# Patient Record
Sex: Female | Born: 1960
Health system: Southern US, Community
[De-identification: ages and names within clinical notes are randomized; demographics above are authoritative.]

## PROBLEM LIST (undated history)

## (undated) DIAGNOSIS — Z8489 Family history of other specified conditions: Secondary | ICD-10-CM

## (undated) DIAGNOSIS — K219 Gastro-esophageal reflux disease without esophagitis: Secondary | ICD-10-CM

## (undated) DIAGNOSIS — I341 Nonrheumatic mitral (valve) prolapse: Secondary | ICD-10-CM

## (undated) DIAGNOSIS — G4730 Sleep apnea, unspecified: Secondary | ICD-10-CM

## (undated) DIAGNOSIS — M1990 Unspecified osteoarthritis, unspecified site: Secondary | ICD-10-CM

## (undated) DIAGNOSIS — G43909 Migraine, unspecified, not intractable, without status migrainosus: Secondary | ICD-10-CM

## (undated) DIAGNOSIS — R42 Dizziness and giddiness: Secondary | ICD-10-CM

## (undated) DIAGNOSIS — E785 Hyperlipidemia, unspecified: Secondary | ICD-10-CM

## (undated) DIAGNOSIS — J45909 Unspecified asthma, uncomplicated: Secondary | ICD-10-CM

## (undated) HISTORY — DX: Hyperlipidemia, unspecified: E78.5

## (undated) HISTORY — PX: TONSILLECTOMY: SUR1361

## (undated) HISTORY — PX: DILATION AND CURETTAGE OF UTERUS: SHX78

## (undated) HISTORY — PX: BILATERAL TEMPOROMANDIBULAR JOINT ARTHROPLASTY: SUR77

## (undated) HISTORY — PX: WISDOM TOOTH EXTRACTION: SHX21

---

## 2005-05-19 ENCOUNTER — Ambulatory Visit: Payer: Self-pay | Admitting: Unknown Physician Specialty

## 2008-04-18 ENCOUNTER — Ambulatory Visit: Payer: Self-pay | Admitting: Unknown Physician Specialty

## 2009-06-12 ENCOUNTER — Ambulatory Visit: Payer: Self-pay | Admitting: Unknown Physician Specialty

## 2009-12-26 ENCOUNTER — Ambulatory Visit: Payer: Self-pay | Admitting: Family Medicine

## 2010-07-15 ENCOUNTER — Ambulatory Visit: Payer: Self-pay | Admitting: Unknown Physician Specialty

## 2010-08-02 HISTORY — PX: COLONOSCOPY: SHX5424

## 2010-08-21 ENCOUNTER — Ambulatory Visit: Payer: Self-pay | Admitting: Gastroenterology

## 2010-08-21 LAB — HM COLONOSCOPY

## 2011-06-27 ENCOUNTER — Ambulatory Visit: Payer: Self-pay

## 2013-04-09 ENCOUNTER — Ambulatory Visit: Payer: Self-pay | Admitting: Family Medicine

## 2015-10-20 ENCOUNTER — Ambulatory Visit (INDEPENDENT_AMBULATORY_CARE_PROVIDER_SITE_OTHER): Payer: BLUE CROSS/BLUE SHIELD | Admitting: Family Medicine

## 2015-10-20 ENCOUNTER — Encounter: Payer: Self-pay | Admitting: Family Medicine

## 2015-10-20 VITALS — BP 102/70 | HR 88 | Temp 97.9°F | Ht 66.0 in | Wt 180.0 lb

## 2015-10-20 DIAGNOSIS — E785 Hyperlipidemia, unspecified: Secondary | ICD-10-CM

## 2015-10-20 DIAGNOSIS — J40 Bronchitis, not specified as acute or chronic: Secondary | ICD-10-CM | POA: Diagnosis not present

## 2015-10-20 DIAGNOSIS — J0100 Acute maxillary sinusitis, unspecified: Secondary | ICD-10-CM | POA: Diagnosis not present

## 2015-10-20 MED ORDER — AMOXICILLIN 500 MG PO CAPS: 500.0000 mg | ORAL_CAPSULE | Freq: Three times a day (TID) | ORAL | Status: DC

## 2015-10-20 MED ORDER — GUAIFENESIN-CODEINE 100-10 MG/5ML PO SYRP: 5.0000 mL | ORAL_SOLUTION | Freq: Three times a day (TID) | ORAL | Status: DC | PRN

## 2015-10-20 NOTE — Progress Notes (Signed)
Name: Kerri Preston   MRN: [id number]    DOB: 06-30-1961   Date:10/20/2015       Progress Note  Subjective  Chief Complaint  Chief Complaint  Patient presents with  . Establish Care  . Cough    cough and cong- dry cough not productive- chest wall pain, myalgias- taking ibuprofen otc for aches, low grade fever on Saturday  . Hyperlipidemia    Cough This is a new problem. The current episode started in the past 7 days. The problem has been gradually worsening. The problem occurs every few minutes. The cough is non-productive. Associated symptoms include a fever, headaches, nasal congestion, postnasal drip, rhinorrhea and a sore throat. Pertinent negatives include no chest pain, chills, ear congestion, ear pain, heartburn, hemoptysis, myalgias, rash, shortness of breath, sweats, weight loss or wheezing. The symptoms are aggravated by pollens. Treatments tried: ibuprofen. The treatment provided mild relief. Her past medical history is significant for asthma and environmental allergies. There is no history of bronchitis or pneumonia.  Hyperlipidemia This is a chronic problem. The current episode started more than 1 year ago. The problem is controlled. Recent lipid tests were reviewed and are normal. There are no known factors aggravating her hyperlipidemia. Pertinent negatives include no chest pain, focal sensory loss, focal weakness, leg pain, myalgias or shortness of breath. Current antihyperlipidemic treatment includes statins and diet change (present off pravastatin). The current treatment provides mild improvement of lipids. There are no compliance problems.     No problem-specific assessment & plan notes found for this encounter.   Past Medical History  Diagnosis Date  . Hyperlipidemia     Past Surgical History  Procedure Laterality Date  . Bilateral temporomandibular joint arthroplasty    . Colonoscopy  2012    cleared for 10 yrs    Family History  Problem Relation Age of Onset   . Hypertension Father     Social History   Social History  . Marital Status: Married    Spouse Name: N/A  . Number of Children: N/A  . Years of Education: N/A   Occupational History  . Not on file.   Social History Main Topics  . Smoking status: Never Smoker   . Smokeless tobacco: Not on file  . Alcohol Use: No  . Drug Use: No  . Sexual Activity: Yes   Other Topics Concern  . Not on file   Social History Narrative  . No narrative on file    Not on File   Review of Systems  Constitutional: Positive for fever. Negative for chills, weight loss and malaise/fatigue.  HENT: Positive for postnasal drip, rhinorrhea and sore throat. Negative for ear discharge and ear pain.   Eyes: Negative for blurred vision.  Respiratory: Positive for cough. Negative for hemoptysis, sputum production, shortness of breath and wheezing.   Cardiovascular: Negative for chest pain, palpitations and leg swelling.  Gastrointestinal: Negative for heartburn, nausea, abdominal pain, diarrhea, constipation, blood in stool and melena.  Genitourinary: Negative for dysuria, urgency, frequency and hematuria.  Musculoskeletal: Negative for myalgias, back pain, joint pain and neck pain.  Skin: Negative for rash.  Neurological: Positive for headaches. Negative for dizziness, tingling, sensory change and focal weakness.  Endo/Heme/Allergies: Positive for environmental allergies. Negative for polydipsia. Does not bruise/bleed easily.  Psychiatric/Behavioral: Negative for depression and suicidal ideas. The patient is not nervous/anxious and does not have insomnia.      Objective  Filed Vitals:   10/20/15 1055  BP: 102/70  Pulse:  88  Temp: 97.9 F (36.6 C)  TempSrc: Oral  Height: 5\' 6"  (1.676 m)  Weight: 180 lb (81.647 kg)    Physical Exam  Constitutional: She is well-developed, well-nourished, and in no distress. No distress.  HENT:  Head: Normocephalic and atraumatic.  Right Ear: External ear  normal.  Left Ear: External ear normal.  Nose: Nose normal.  Mouth/Throat: Oropharynx is clear and moist.  Eyes: Conjunctivae and EOM are normal. Pupils are equal, round, and reactive to light. Right eye exhibits no discharge. Left eye exhibits no discharge.  Neck: Normal range of motion. Neck supple. No JVD present. No thyromegaly present.  Cardiovascular: Normal rate, regular rhythm, normal heart sounds and intact distal pulses.  Exam reveals no gallop and no friction rub.   No murmur heard. Pulmonary/Chest: Effort normal and breath sounds normal.  Abdominal: Soft. Bowel sounds are normal. She exhibits no mass. There is no tenderness. There is no guarding.  Musculoskeletal: Normal range of motion. She exhibits no edema.  Lymphadenopathy:    She has no cervical adenopathy.  Neurological: She is alert. She has normal reflexes.  Skin: Skin is warm and dry. She is not diaphoretic.  Psychiatric: Mood and affect normal.      Assessment & Plan  Problem List Items Addressed This Visit    None    Visit Diagnoses    Acute maxillary sinusitis, recurrence not specified    -  Primary    Relevant Medications    amoxicillin (AMOXIL) 500 MG capsule    guaiFENesin-codeine (ROBITUSSIN AC) 100-10 MG/5ML syrup    Bronchitis        Relevant Medications    amoxicillin (AMOXIL) 500 MG capsule    guaiFENesin-codeine (ROBITUSSIN AC) 100-10 MG/5ML syrup    Hyperlipidemia        currently diet controlled    Relevant Medications    pravastatin (PRAVACHOL) 10 MG tablet    Other Relevant Orders    Lipid Profile         Dr. Otilio Miu [facility]  10/20/2015

## 2015-10-21 LAB — LIPID PANEL
Chol/HDL Ratio: 5.6 ratio units — ABNORMAL HIGH (ref 0.0–4.4)
Cholesterol, Total: 257 mg/dL — ABNORMAL HIGH (ref 100–199)
HDL: 46 mg/dL (ref >39)
LDL Calculated: 132 mg/dL — ABNORMAL HIGH (ref 0–99)
Triglycerides: 394 mg/dL — ABNORMAL HIGH (ref 0–149)
VLDL Cholesterol Cal: 79 mg/dL — ABNORMAL HIGH (ref 5–40)

## 2015-10-21 MED ORDER — PRAVASTATIN SODIUM 10 MG PO TABS: 10.0000 mg | ORAL_TABLET | Freq: Every day | ORAL | Status: DC

## 2015-10-21 NOTE — Addendum Note (Signed)
Addended by: Fredderick Severance on: 10/21/2015 11:44 AM   Modules accepted: Orders

## 2015-10-28 ENCOUNTER — Ambulatory Visit (INDEPENDENT_AMBULATORY_CARE_PROVIDER_SITE_OTHER): Payer: BLUE CROSS/BLUE SHIELD | Admitting: Family Medicine

## 2015-10-28 ENCOUNTER — Encounter: Payer: Self-pay | Admitting: Family Medicine

## 2015-10-28 ENCOUNTER — Other Ambulatory Visit: Payer: Self-pay

## 2015-10-28 ENCOUNTER — Ambulatory Visit
Admission: RE | Admit: 2015-10-28 | Discharge: 2015-10-28 | Disposition: A | Discharge status: Home / Self Care | Payer: BLUE CROSS/BLUE SHIELD | Source: Ambulatory Visit | Attending: Family Medicine | Admitting: Family Medicine

## 2015-10-28 VITALS — BP 100/62 | HR 76 | Temp 98.0°F | Ht 66.0 in | Wt 180.0 lb

## 2015-10-28 DIAGNOSIS — J0100 Acute maxillary sinusitis, unspecified: Secondary | ICD-10-CM | POA: Diagnosis not present

## 2015-10-28 DIAGNOSIS — J4521 Mild intermittent asthma with (acute) exacerbation: Secondary | ICD-10-CM

## 2015-10-28 DIAGNOSIS — J40 Bronchitis, not specified as acute or chronic: Secondary | ICD-10-CM | POA: Diagnosis not present

## 2015-10-28 MED ORDER — BENZONATATE 100 MG PO CAPS: 100.0000 mg | ORAL_CAPSULE | Freq: Two times a day (BID) | ORAL | Status: DC | PRN

## 2015-10-28 MED ORDER — ALBUTEROL SULFATE HFA 108 (90 BASE) MCG/ACT IN AERS: 2.0000 | INHALATION_SPRAY | Freq: Four times a day (QID) | RESPIRATORY_TRACT | Status: DC | PRN

## 2015-10-28 MED ORDER — AZITHROMYCIN 250 MG PO TABS: ORAL_TABLET | ORAL | Status: DC

## 2015-10-28 NOTE — Progress Notes (Signed)
Name: Kerri Preston   MRN: [id number]    DOB: 02/28/1961   Date:10/28/2015       Progress Note  Subjective  Chief Complaint  Chief Complaint  Patient presents with  . Sinusitis    started Amox. on 3/20- was feeling better until last night- cough got worse, feeling weak and sob    Sinusitis This is a recurrent problem. The current episode started 1 to 4 weeks ago. The problem has been waxing and waning since onset. There has been no fever. The pain is mild. Associated symptoms include congestion, coughing, ear pain, headaches, sinus pressure, sneezing and a sore throat. Pertinent negatives include no chills, diaphoresis, hoarse voice, neck pain, shortness of breath or swollen glands. Past treatments include acetaminophen (amoxil). The treatment provided mild relief.  Cough This is a recurrent problem. The problem has been waxing and waning. The cough is non-productive. Associated symptoms include ear pain, headaches, nasal congestion, postnasal drip, rhinorrhea and a sore throat. Pertinent negatives include no chest pain, chills, fever, heartburn, myalgias, rash, shortness of breath, weight loss or wheezing. The treatment provided mild relief. There is no history of environmental allergies.    No problem-specific assessment & plan notes found for this encounter.   Past Medical History  Diagnosis Date  . Hyperlipidemia     Past Surgical History  Procedure Laterality Date  . Bilateral temporomandibular joint arthroplasty    . Colonoscopy  2012    cleared for 10 yrs    Family History  Problem Relation Age of Onset  . Hypertension Father     Social History   Social History  . Marital Status: Married    Spouse Name: N/A  . Number of Children: N/A  . Years of Education: N/A   Occupational History  . Not on file.   Social History Main Topics  . Smoking status: Never Smoker   . Smokeless tobacco: Not on file  . Alcohol Use: No  . Drug Use: No  . Sexual Activity: Yes    Other Topics Concern  . Not on file   Social History Narrative    No Known Allergies   Review of Systems  Constitutional: Negative for fever, chills, weight loss, malaise/fatigue and diaphoresis.  HENT: Positive for congestion, ear pain, postnasal drip, rhinorrhea, sinus pressure, sneezing and sore throat. Negative for ear discharge and hoarse voice.   Eyes: Negative for blurred vision.  Respiratory: Positive for cough. Negative for sputum production, shortness of breath and wheezing.   Cardiovascular: Negative for chest pain, palpitations and leg swelling.  Gastrointestinal: Negative for heartburn, nausea, abdominal pain, diarrhea, constipation, blood in stool and melena.  Genitourinary: Negative for dysuria, urgency, frequency and hematuria.  Musculoskeletal: Negative for myalgias, back pain, joint pain and neck pain.  Skin: Negative for rash.  Neurological: Positive for headaches. Negative for dizziness, tingling, sensory change and focal weakness.  Endo/Heme/Allergies: Negative for environmental allergies and polydipsia. Does not bruise/bleed easily.  Psychiatric/Behavioral: Negative for depression and suicidal ideas. The patient is not nervous/anxious and does not have insomnia.      Objective  Filed Vitals:   10/28/15 1607  BP: 100/62  Pulse: 76  Temp: 98 F (36.7 C)  TempSrc: Oral  Height: 5\' 6"  (1.676 m)  Weight: 180 lb (81.647 kg)  SpO2: 99%    Physical Exam  Constitutional: She is well-developed, well-nourished, and in no distress. No distress.  HENT:  Head: Normocephalic and atraumatic.  Right Ear: External ear normal.  Left Ear:  External ear normal.  Nose: Nose normal.  Mouth/Throat: Oropharynx is clear and moist.  Eyes: Conjunctivae and EOM are normal. Pupils are equal, round, and reactive to light. Right eye exhibits no discharge. Left eye exhibits no discharge.  Neck: Normal range of motion. Neck supple. No JVD present. No thyromegaly present.   Cardiovascular: Normal rate, regular rhythm, normal heart sounds and intact distal pulses.  Exam reveals no gallop and no friction rub.   No murmur heard. Pulmonary/Chest: Effort normal and breath sounds normal.  Abdominal: Soft. Bowel sounds are normal. She exhibits no mass. There is no tenderness. There is no guarding.  Musculoskeletal: Normal range of motion. She exhibits no edema.  Lymphadenopathy:    She has no cervical adenopathy.  Neurological: She is alert. She has normal reflexes.  Skin: Skin is warm and dry. She is not diaphoretic.  Psychiatric: Mood and affect normal.  Nursing note and vitals reviewed.     Assessment & Plan  Problem List Items Addressed This Visit    None    Visit Diagnoses    Bronchitis    -  Primary    Relevant Medications    azithromycin (ZITHROMAX) 250 MG tablet    benzonatate (TESSALON) 100 MG capsule    Other Relevant Orders    DG Chest 2 View    Reactive airway disease, mild intermittent, with acute exacerbation        symbicort    Relevant Medications    albuterol (PROVENTIL HFA;VENTOLIN HFA) 108 (90 Base) MCG/ACT inhaler    Other Relevant Orders    DG Chest 2 View    Acute maxillary sinusitis, recurrence not specified        Relevant Medications    azithromycin (ZITHROMAX) 250 MG tablet    benzonatate (TESSALON) 100 MG capsule         Dr. Nickalus Thornsberry [facility]  10/28/2015

## 2015-10-29 ENCOUNTER — Ambulatory Visit
Admission: RE | Admit: 2015-10-29 | Discharge: 2015-10-29 | Disposition: A | Discharge status: Home / Self Care | Payer: BLUE CROSS/BLUE SHIELD | Source: Ambulatory Visit | Attending: Family Medicine | Admitting: Family Medicine

## 2015-10-29 DIAGNOSIS — J209 Acute bronchitis, unspecified: Secondary | ICD-10-CM | POA: Diagnosis not present

## 2015-10-29 DIAGNOSIS — J40 Bronchitis, not specified as acute or chronic: Secondary | ICD-10-CM | POA: Diagnosis present

## 2015-10-29 DIAGNOSIS — J4521 Mild intermittent asthma with (acute) exacerbation: Secondary | ICD-10-CM

## 2016-01-05 ENCOUNTER — Other Ambulatory Visit: Payer: Self-pay

## 2016-01-05 DIAGNOSIS — E785 Hyperlipidemia, unspecified: Secondary | ICD-10-CM

## 2016-01-05 MED ORDER — PRAVASTATIN SODIUM 10 MG PO TABS: 10.0000 mg | ORAL_TABLET | Freq: Every day | ORAL | Status: DC

## 2016-01-19 DIAGNOSIS — D485 Neoplasm of uncertain behavior of skin: Secondary | ICD-10-CM | POA: Diagnosis not present

## 2016-01-19 DIAGNOSIS — L570 Actinic keratosis: Secondary | ICD-10-CM | POA: Diagnosis not present

## 2016-01-19 DIAGNOSIS — L814 Other melanin hyperpigmentation: Secondary | ICD-10-CM | POA: Diagnosis not present

## 2016-02-01 ENCOUNTER — Other Ambulatory Visit: Payer: Self-pay | Admitting: Family Medicine

## 2016-03-10 ENCOUNTER — Other Ambulatory Visit: Payer: Self-pay | Admitting: Family Medicine

## 2016-03-23 DIAGNOSIS — Z124 Encounter for screening for malignant neoplasm of cervix: Secondary | ICD-10-CM | POA: Diagnosis not present

## 2016-03-23 DIAGNOSIS — Z01419 Encounter for gynecological examination (general) (routine) without abnormal findings: Secondary | ICD-10-CM | POA: Diagnosis not present

## 2016-03-23 LAB — HM PAP SMEAR: HM Pap smear: NORMAL

## 2016-04-22 ENCOUNTER — Other Ambulatory Visit: Payer: Self-pay | Admitting: Advanced Practice Midwife

## 2016-04-22 DIAGNOSIS — Z1231 Encounter for screening mammogram for malignant neoplasm of breast: Secondary | ICD-10-CM

## 2016-05-06 ENCOUNTER — Ambulatory Visit
Admission: RE | Admit: 2016-05-06 | Discharge: 2016-05-06 | Disposition: A | Discharge status: Home / Self Care | Payer: BLUE CROSS/BLUE SHIELD | Source: Ambulatory Visit | Attending: Advanced Practice Midwife | Admitting: Advanced Practice Midwife

## 2016-05-06 DIAGNOSIS — Z1231 Encounter for screening mammogram for malignant neoplasm of breast: Secondary | ICD-10-CM | POA: Insufficient documentation

## 2016-05-12 ENCOUNTER — Inpatient Hospital Stay
Admission: RE | Admit: 2016-05-12 | Discharge: 2016-05-12 | Disposition: A | Discharge status: Home / Self Care | Payer: Self-pay | Source: Ambulatory Visit | Attending: *Deleted | Admitting: *Deleted

## 2016-05-12 ENCOUNTER — Other Ambulatory Visit: Payer: Self-pay | Admitting: *Deleted

## 2016-05-12 DIAGNOSIS — Z1231 Encounter for screening mammogram for malignant neoplasm of breast: Secondary | ICD-10-CM

## 2016-06-22 DIAGNOSIS — Z23 Encounter for immunization: Secondary | ICD-10-CM | POA: Diagnosis not present

## 2016-06-26 ENCOUNTER — Other Ambulatory Visit: Payer: Self-pay | Admitting: Family Medicine

## 2016-06-28 ENCOUNTER — Other Ambulatory Visit: Payer: Self-pay

## 2016-07-20 DIAGNOSIS — L814 Other melanin hyperpigmentation: Secondary | ICD-10-CM | POA: Diagnosis not present

## 2016-07-28 ENCOUNTER — Other Ambulatory Visit: Payer: Self-pay | Admitting: Family Medicine

## 2016-08-26 ENCOUNTER — Ambulatory Visit (INDEPENDENT_AMBULATORY_CARE_PROVIDER_SITE_OTHER): Payer: BLUE CROSS/BLUE SHIELD | Admitting: Family Medicine

## 2016-08-26 VITALS — BP 120/74 | HR 86 | Temp 98.1°F | Ht 66.0 in | Wt 178.0 lb

## 2016-08-26 DIAGNOSIS — J0100 Acute maxillary sinusitis, unspecified: Secondary | ICD-10-CM

## 2016-08-26 DIAGNOSIS — J40 Bronchitis, not specified as acute or chronic: Secondary | ICD-10-CM

## 2016-08-26 LAB — POCT INFLUENZA A/B
Influenza A, POC: NEGATIVE
Influenza B, POC: NEGATIVE

## 2016-08-26 MED ORDER — AMOXICILLIN 500 MG PO CAPS: 500.0000 mg | ORAL_CAPSULE | Freq: Three times a day (TID) | ORAL | 0 refills | Status: DC

## 2016-08-26 MED ORDER — GUAIFENESIN-CODEINE 100-10 MG/5ML PO SYRP: 5.0000 mL | ORAL_SOLUTION | Freq: Three times a day (TID) | ORAL | 0 refills | Status: DC | PRN

## 2016-08-26 NOTE — Progress Notes (Signed)
Name: Kerri Preston   MRN: [id number]    DOB: 1961/02/11   Date:08/26/2016       Progress Note  Subjective  Chief Complaint  Chief Complaint  Patient presents with  . Sinusitis    cough, cong, sore throat, back pain, ears popping    Sinusitis  This is a new problem. The current episode started in the past 7 days. The problem has been gradually worsening since onset. There has been no fever. The pain is mild (headache). Associated symptoms include chills, congestion, coughing, headaches, a hoarse voice, shortness of breath, sinus pressure, sneezing and a sore throat. Pertinent negatives include no diaphoresis, ear pain, neck pain or swollen glands. (Cough with "nasty taste") Past treatments include oral decongestants. The treatment provided no relief.    No problem-specific Assessment & Plan notes found for this encounter.   Past Medical History:  Diagnosis Date  . Hyperlipidemia     Past Surgical History:  Procedure Laterality Date  . BILATERAL TEMPOROMANDIBULAR JOINT ARTHROPLASTY    . COLONOSCOPY  2012   cleared for 10 yrs    Family History  Problem Relation Age of Onset  . Hypertension Father   . Breast cancer Paternal Aunt     19's    Social History   Social History  . Marital status: Married    Spouse name: N/A  . Number of children: N/A  . Years of education: N/A   Occupational History  . Not on file.   Social History Main Topics  . Smoking status: Never Smoker  . Smokeless tobacco: Not on file  . Alcohol use No  . Drug use: No  . Sexual activity: Yes   Other Topics Concern  . Not on file   Social History Narrative  . No narrative on file    No Known Allergies   Review of Systems  Constitutional: Positive for chills. Negative for diaphoresis, fever, malaise/fatigue and weight loss.  HENT: Positive for congestion, hoarse voice, sinus pressure, sneezing and sore throat. Negative for ear discharge, ear pain, nosebleeds, sinus pain and tinnitus.    Eyes: Negative for blurred vision.  Respiratory: Positive for cough, shortness of breath and wheezing. Negative for hemoptysis, sputum production and stridor.   Cardiovascular: Positive for chest pain. Negative for palpitations, orthopnea, claudication, leg swelling and PND.  Gastrointestinal: Negative for abdominal pain, blood in stool, constipation, diarrhea, heartburn, melena and nausea.  Genitourinary: Negative for dysuria, frequency, hematuria and urgency.  Musculoskeletal: Negative for joint pain, myalgias and neck pain.  Skin: Negative for rash.  Neurological: Positive for headaches. Negative for dizziness, tingling, sensory change and focal weakness.  Endo/Heme/Allergies: Negative for environmental allergies and polydipsia. Does not bruise/bleed easily.  Psychiatric/Behavioral: Negative for depression and suicidal ideas. The patient is not nervous/anxious and does not have insomnia.      Objective  Vitals:   08/26/16 1606  BP: 120/74  Pulse: 86  Temp: 98.1 F (36.7 C)  TempSrc: Oral  SpO2: 99%  Weight: 178 lb (80.7 kg)  Height: 5\' 6"  (1.676 m)    Physical Exam  Constitutional: She is well-developed, well-nourished, and in no distress. No distress.  HENT:  Head: Normocephalic and atraumatic.  Right Ear: External ear normal.  Left Ear: External ear normal.  Nose: Nose normal.  Mouth/Throat: Oropharynx is clear and moist.  Eyes: Conjunctivae and EOM are normal. Pupils are equal, round, and reactive to light. Right eye exhibits no discharge. Left eye exhibits no discharge.  Neck: Normal range  of motion. Neck supple. No JVD present. No thyromegaly present.  Cardiovascular: Normal rate, regular rhythm, normal heart sounds and intact distal pulses.  Exam reveals no gallop and no friction rub.   No murmur heard. Pulmonary/Chest: Effort normal and breath sounds normal.  Abdominal: Soft. Bowel sounds are normal. She exhibits no mass. There is no tenderness. There is no  guarding.  Musculoskeletal: Normal range of motion. She exhibits no edema.  Lymphadenopathy:    She has no cervical adenopathy.  Neurological: She is alert.  Skin: Skin is warm and dry. She is not diaphoretic.  Psychiatric: Mood and affect normal.  Nursing note and vitals reviewed.     Assessment & Plan  Problem List Items Addressed This Visit    None    Visit Diagnoses    Bronchitis    -  Primary   Relevant Medications   guaiFENesin-codeine (ROBITUSSIN AC) 100-10 MG/5ML syrup   amoxicillin (AMOXIL) 500 MG capsule   Other Relevant Orders   POCT Influenza A/B (Completed)   Acute non-recurrent maxillary sinusitis       Relevant Medications   guaiFENesin-codeine (ROBITUSSIN AC) 100-10 MG/5ML syrup   amoxicillin (AMOXIL) 500 MG capsule   Acute maxillary sinusitis, recurrence not specified       Relevant Medications   guaiFENesin-codeine (ROBITUSSIN AC) 100-10 MG/5ML syrup   amoxicillin (AMOXIL) 500 MG capsule        Dr. Deanna Jones [facility]  08/26/16

## 2016-09-16 ENCOUNTER — Other Ambulatory Visit: Payer: Self-pay | Admitting: Family Medicine

## 2016-09-30 ENCOUNTER — Encounter: Payer: Self-pay | Admitting: Family Medicine

## 2016-09-30 ENCOUNTER — Ambulatory Visit (INDEPENDENT_AMBULATORY_CARE_PROVIDER_SITE_OTHER): Payer: BLUE CROSS/BLUE SHIELD | Admitting: Family Medicine

## 2016-09-30 VITALS — BP 124/70 | HR 64 | Ht 66.0 in | Wt 178.0 lb

## 2016-09-30 DIAGNOSIS — J4521 Mild intermittent asthma with (acute) exacerbation: Secondary | ICD-10-CM | POA: Diagnosis not present

## 2016-09-30 DIAGNOSIS — E782 Mixed hyperlipidemia: Secondary | ICD-10-CM | POA: Diagnosis not present

## 2016-09-30 DIAGNOSIS — Z23 Encounter for immunization: Secondary | ICD-10-CM

## 2016-09-30 MED ORDER — ALBUTEROL SULFATE HFA 108 (90 BASE) MCG/ACT IN AERS: 2.0000 | INHALATION_SPRAY | Freq: Four times a day (QID) | RESPIRATORY_TRACT | 2 refills | Status: DC | PRN

## 2016-09-30 MED ORDER — PRAVASTATIN SODIUM 10 MG PO TABS: 10.0000 mg | ORAL_TABLET | Freq: Every day | ORAL | 3 refills | Status: DC

## 2016-09-30 NOTE — Progress Notes (Signed)
Name: Kerri Preston   MRN: [id number]    DOB: 1960-08-15   Date:09/30/2016       Progress Note  Subjective  Chief Complaint  Chief Complaint  Patient presents with  . Hyperlipidemia    refill pravastatin and get labs    Hyperlipidemia  This is a chronic problem. The problem is controlled. Recent lipid tests were reviewed and are normal. Exacerbating diseases include obesity. She has no history of chronic renal disease, diabetes, hypothyroidism, liver disease or nephrotic syndrome. There are no known factors aggravating her hyperlipidemia. Pertinent negatives include no chest pain, focal sensory loss, focal weakness, leg pain, myalgias or shortness of breath. Current antihyperlipidemic treatment includes statins. The current treatment provides mild improvement of lipids. There are no compliance problems.  Risk factors for coronary artery disease include dyslipidemia.  Asthma  There is no chest tightness, cough, difficulty breathing, frequent throat clearing, hemoptysis, hoarse voice, shortness of breath, sputum production or wheezing. This is a chronic problem. The problem occurs intermittently. The problem has been gradually improving. Pertinent negatives include no chest pain, ear pain, fever, headaches, heartburn, malaise/fatigue, myalgias, nasal congestion, sore throat or weight loss. Her symptoms are aggravated by change in weather and pollen. Her symptoms are alleviated by beta-agonist. There are no known risk factors for lung disease. Her past medical history is significant for asthma.    No problem-specific Assessment & Plan notes found for this encounter.   Past Medical History:  Diagnosis Date  . Hyperlipidemia     Past Surgical History:  Procedure Laterality Date  . BILATERAL TEMPOROMANDIBULAR JOINT ARTHROPLASTY    . COLONOSCOPY  2012   cleared for 10 yrs    Family History  Problem Relation Age of Onset  . Hypertension Father   . Breast cancer Paternal Aunt     14's     Social History   Social History  . Marital status: Married    Spouse name: N/A  . Number of children: N/A  . Years of education: N/A   Occupational History  . Not on file.   Social History Main Topics  . Smoking status: Never Smoker  . Smokeless tobacco: Not on file  . Alcohol use No  . Drug use: No  . Sexual activity: Yes   Other Topics Concern  . Not on file   Social History Narrative  . No narrative on file    No Known Allergies  Outpatient Medications Prior to Visit  Medication Sig Dispense Refill  . albuterol (PROVENTIL HFA;VENTOLIN HFA) 108 (90 Base) MCG/ACT inhaler Inhale 2 puffs into the lungs every 6 (six) hours as needed for wheezing or shortness of breath. 1 Inhaler 0  . pravastatin (PRAVACHOL) 10 MG tablet TAKE 1 TABLET BY MOUTH EVERY DAY 15 tablet 0  . amoxicillin (AMOXIL) 500 MG capsule Take 1 capsule (500 mg total) by mouth 3 (three) times daily. 30 capsule 0  . guaiFENesin-codeine (ROBITUSSIN AC) 100-10 MG/5ML syrup Take 5 mLs by mouth 3 (three) times daily as needed for cough. 150 mL 0   No facility-administered medications prior to visit.     Review of Systems  Constitutional: Negative for chills, fever, malaise/fatigue and weight loss.  HENT: Negative for ear discharge, ear pain, hoarse voice and sore throat.   Eyes: Negative for blurred vision.  Respiratory: Negative for cough, hemoptysis, sputum production, shortness of breath and wheezing.   Cardiovascular: Negative for chest pain, palpitations and leg swelling.  Gastrointestinal: Negative for abdominal pain, blood in  stool, constipation, diarrhea, heartburn, melena and nausea.  Genitourinary: Negative for dysuria, frequency, hematuria and urgency.  Musculoskeletal: Negative for back pain, joint pain, myalgias and neck pain.  Skin: Negative for rash.  Neurological: Negative for dizziness, tingling, sensory change, focal weakness and headaches.  Endo/Heme/Allergies: Negative for environmental  allergies and polydipsia. Does not bruise/bleed easily.  Psychiatric/Behavioral: Negative for depression and suicidal ideas. The patient is not nervous/anxious and does not have insomnia.      Objective  Vitals:   09/30/16 0846  BP: 124/70  Pulse: 64  Weight: 178 lb (80.7 kg)  Height: 5\' 6"  (1.676 m)    Physical Exam  Constitutional: She is well-developed, well-nourished, and in no distress. No distress.  HENT:  Head: Normocephalic and atraumatic.  Right Ear: External ear normal.  Left Ear: External ear normal.  Nose: Nose normal.  Mouth/Throat: Oropharynx is clear and moist.  Eyes: Conjunctivae and EOM are normal. Pupils are equal, round, and reactive to light. Right eye exhibits no discharge. Left eye exhibits no discharge.  Neck: Normal range of motion. Neck supple. No JVD present. No thyromegaly present.  Cardiovascular: Normal rate, regular rhythm, normal heart sounds and intact distal pulses.  Exam reveals no gallop and no friction rub.   No murmur heard. Pulmonary/Chest: Effort normal and breath sounds normal. She has no wheezes. She has no rales.  Abdominal: Soft. Bowel sounds are normal. She exhibits no mass. There is no tenderness. There is no guarding.  Musculoskeletal: Normal range of motion. She exhibits no edema.  Lymphadenopathy:    She has no cervical adenopathy.  Neurological: She is alert. She has normal reflexes.  Skin: Skin is warm and dry. She is not diaphoretic.  Psychiatric: Mood and affect normal.  Nursing note and vitals reviewed.     Assessment & Plan  Problem List Items Addressed This Visit      Respiratory   Reactive airway disease, mild intermittent, with acute exacerbation   Relevant Medications   albuterol (PROVENTIL HFA;VENTOLIN HFA) 108 (90 Base) MCG/ACT inhaler     Other   Mixed hyperlipidemia - Primary   Relevant Medications   pravastatin (PRAVACHOL) 10 MG tablet   Other Relevant Orders   Lipid Profile    Other Visit Diagnoses     Need for diphtheria-tetanus-pertussis (Tdap) vaccine          Meds ordered this encounter  Medications  . pravastatin (PRAVACHOL) 10 MG tablet    Sig: Take 1 tablet (10 mg total) by mouth daily.    Dispense:  90 tablet    Refill:  3    Needs med refill appt with labs  . albuterol (PROVENTIL HFA;VENTOLIN HFA) 108 (90 Base) MCG/ACT inhaler    Sig: Inhale 2 puffs into the lungs every 6 (six) hours as needed for wheezing or shortness of breath.    Dispense:  1 Inhaler    Refill:  2      Dr. Mac[facility]  09/30/16

## 2016-10-01 LAB — LIPID PANEL
Chol/HDL Ratio: 4.1 ratio units (ref 0.0–4.4)
Cholesterol, Total: 221 mg/dL — ABNORMAL HIGH (ref 100–199)
HDL: 54 mg/dL (ref >39)
LDL Calculated: 141 mg/dL — ABNORMAL HIGH (ref 0–99)
Triglycerides: 132 mg/dL (ref 0–149)
VLDL Cholesterol Cal: 26 mg/dL (ref 5–40)

## 2017-10-07 ENCOUNTER — Other Ambulatory Visit: Payer: Self-pay | Admitting: Family Medicine

## 2017-11-09 ENCOUNTER — Other Ambulatory Visit: Payer: Self-pay | Admitting: Family Medicine

## 2017-12-30 ENCOUNTER — Telehealth: Payer: Self-pay | Admitting: Family Medicine

## 2017-12-30 ENCOUNTER — Other Ambulatory Visit: Payer: Self-pay

## 2017-12-30 ENCOUNTER — Encounter (INDEPENDENT_AMBULATORY_CARE_PROVIDER_SITE_OTHER): Payer: Self-pay

## 2017-12-30 MED ORDER — PRAVASTATIN SODIUM 10 MG PO TABS: 10.0000 mg | ORAL_TABLET | Freq: Every day | ORAL | 0 refills | Status: DC

## 2017-12-30 NOTE — Telephone Encounter (Signed)
Patient is requesting refills on her med's she completely out she's also scheduled;ed to come in June 13 at 8:15

## 2017-12-30 NOTE — Telephone Encounter (Signed)
Sent in 15 days until seen

## 2018-01-02 DIAGNOSIS — X32XXXA Exposure to sunlight, initial encounter: Secondary | ICD-10-CM | POA: Diagnosis not present

## 2018-01-02 DIAGNOSIS — D2272 Melanocytic nevi of left lower limb, including hip: Secondary | ICD-10-CM | POA: Diagnosis not present

## 2018-01-02 DIAGNOSIS — L570 Actinic keratosis: Secondary | ICD-10-CM | POA: Diagnosis not present

## 2018-01-02 DIAGNOSIS — C44519 Basal cell carcinoma of skin of other part of trunk: Secondary | ICD-10-CM | POA: Diagnosis not present

## 2018-01-02 DIAGNOSIS — D2261 Melanocytic nevi of right upper limb, including shoulder: Secondary | ICD-10-CM | POA: Diagnosis not present

## 2018-01-02 DIAGNOSIS — D485 Neoplasm of uncertain behavior of skin: Secondary | ICD-10-CM | POA: Diagnosis not present

## 2018-01-02 DIAGNOSIS — D2262 Melanocytic nevi of left upper limb, including shoulder: Secondary | ICD-10-CM | POA: Diagnosis not present

## 2018-01-02 DIAGNOSIS — D2271 Melanocytic nevi of right lower limb, including hip: Secondary | ICD-10-CM | POA: Diagnosis not present

## 2018-01-02 DIAGNOSIS — D0439 Carcinoma in situ of skin of other parts of face: Secondary | ICD-10-CM | POA: Diagnosis not present

## 2018-01-03 ENCOUNTER — Other Ambulatory Visit: Payer: Self-pay

## 2018-01-03 ENCOUNTER — Ambulatory Visit (INDEPENDENT_AMBULATORY_CARE_PROVIDER_SITE_OTHER): Payer: BLUE CROSS/BLUE SHIELD | Admitting: Obstetrics and Gynecology

## 2018-01-03 ENCOUNTER — Other Ambulatory Visit: Payer: Self-pay | Admitting: Obstetrics and Gynecology

## 2018-01-03 ENCOUNTER — Encounter: Payer: Self-pay | Admitting: Obstetrics and Gynecology

## 2018-01-03 VITALS — BP 110/70 | HR 67 | Ht 66.0 in | Wt 177.0 lb

## 2018-01-03 DIAGNOSIS — Z1331 Encounter for screening for depression: Secondary | ICD-10-CM

## 2018-01-03 DIAGNOSIS — Z1339 Encounter for screening examination for other mental health and behavioral disorders: Secondary | ICD-10-CM

## 2018-01-03 DIAGNOSIS — Z01419 Encounter for gynecological examination (general) (routine) without abnormal findings: Secondary | ICD-10-CM | POA: Diagnosis not present

## 2018-01-03 DIAGNOSIS — Z1231 Encounter for screening mammogram for malignant neoplasm of breast: Secondary | ICD-10-CM

## 2018-01-03 NOTE — Progress Notes (Signed)
Routine Annual Gynecology Examination   PCP: Juline Patch, MD  Chief Complaint  Patient presents with  . Gynecologic Exam    No complaints   History of Present Illness: Patient is a 57 y.o. [id number] presents for annual exam. She is an established patient of this practice.  The patient has no complaints today.   Menopausal bleeding: denies  Menopausal symptoms: reports occasional hot flashes  Breast symptoms: denies  Last pap smear: 2 years ago.  Result Normal, HPV negative  Last mammogram: 05/06/2016.  Result: BiRads 1  Exercises 5 times per week  Uses seat belt every time she gets in the car.   Colonoscopy: 2012, not due.   Past Medical History:  Diagnosis Date  . Hyperlipidemia     Past Surgical History:  Procedure Laterality Date  . BILATERAL TEMPOROMANDIBULAR JOINT ARTHROPLASTY    . COLONOSCOPY  2012   cleared for 10 yrs  . DILATION AND CURETTAGE OF UTERUS    . TONSILLECTOMY    . WISDOM TOOTH EXTRACTION      Medications   Medication Sig Start Date End Date Taking? Authorizing Provider  albuterol (PROVENTIL HFA;VENTOLIN HFA) 108 (90 Base) MCG/ACT inhaler Inhale 2 puffs into the lungs every 6 (six) hours as needed for wheezing or shortness of breath. Patient not taking: Reported on 01/03/2018 09/30/16   Juline Patch, MD  pravastatin (PRAVACHOL) 10 MG tablet Take 1 tablet (10 mg total) by mouth daily. 12/30/17   Juline Patch, MD   Allergies: No Known Allergies  Obstetric History: [id number]  Social History   Socioeconomic History  . Marital status: Married    Spouse name: Not on file  . Number of children: Not on file  . Years of education: Not on file  . Highest education level: Not on file  Occupational History  . Not on file  Social Needs  . Financial resource strain: Not on file  . Food insecurity:    Worry: Not on file    Inability: Not on file  . Transportation needs:    Medical: Not on file    Non-medical: Not on file  Tobacco Use  .  Smoking status: Never Smoker  . Smokeless tobacco: Never Used  Substance and Sexual Activity  . Alcohol use: No    Alcohol/week: 0.0 oz  . Drug use: No  . Sexual activity: Yes  Lifestyle  . Physical activity:    Days per week: 6 days    Minutes per session: 50 min  . Stress: Not on file  Relationships  . Social connections:    Talks on phone: Not on file    Gets together: Not on file    Attends religious service: Not on file    Active member of club or organization: Not on file    Attends meetings of clubs or organizations: Not on file    Relationship status: Not on file  . Intimate partner violence:    Fear of current or ex partner: Not on file    Emotionally abused: Not on file    Physically abused: Not on file    Forced sexual activity: Not on file  Other Topics Concern  . Not on file  Social History Narrative  . Not on file    Family History  Problem Relation Age of Onset  . Hypertension Father   . Breast cancer Paternal Aunt        26's  . Osteoarthritis Mother   . Osteoporosis Mother  Review of Systems  Constitutional: Negative.   HENT: Negative.   Eyes: Negative.   Respiratory: Negative.   Cardiovascular: Negative.   Gastrointestinal: Negative.   Genitourinary: Negative.   Musculoskeletal: Negative.   Skin: Negative.   Neurological: Negative.   Psychiatric/Behavioral: Negative.      Physical Exam Vitals: BP 110/70 (BP Location: Left Arm, Patient Position: Sitting, Cuff Size: Normal)   Pulse 67   Ht 5\' 6"  (1.676 m)   Wt 177 lb (80.3 kg)   BMI 28.57 kg/m   Physical Exam  Constitutional: She is oriented to person, place, and time. She appears well-developed and well-nourished. No distress.  Genitourinary: Uterus normal. Pelvic exam was performed with patient supine. There is no rash, tenderness, lesion or injury on the right labia. There is no rash, tenderness, lesion or injury on the left labia. No erythema, tenderness or bleeding in the vagina.  No signs of injury around the vagina. No vaginal discharge found. Right adnexum does not display mass, does not display tenderness and does not display fullness. Left adnexum does not display mass, does not display tenderness and does not display fullness. Cervix does not exhibit motion tenderness, lesion, discharge or polyp.   Uterus is mobile and anteverted. Uterus is not enlarged, tender or exhibiting a mass.  HENT:  Head: Normocephalic and atraumatic.  Eyes: EOM are normal. No scleral icterus.  Neck: Normal range of motion. Neck supple. No thyromegaly present.  Cardiovascular: Normal rate and regular rhythm. Exam reveals no gallop and no friction rub.  No murmur heard. Pulmonary/Chest: Effort normal and breath sounds normal. No respiratory distress. She has no wheezes. She has no rales. Right breast exhibits no inverted nipple, no mass, no nipple discharge, no skin change and no tenderness. Left breast exhibits no inverted nipple, no mass, no nipple discharge, no skin change and no tenderness.  Abdominal: Soft. Bowel sounds are normal. She exhibits no distension and no mass. There is no tenderness. There is no rebound and no guarding.  Musculoskeletal: Normal range of motion. She exhibits no edema or tenderness.  Lymphadenopathy:    She has no cervical adenopathy.       Right: No inguinal adenopathy present.       Left: No inguinal adenopathy present.  Neurological: She is alert and oriented to person, place, and time. No cranial nerve deficit.  Skin: Skin is warm and dry. No rash noted. No erythema.  Psychiatric: She has a normal mood and affect. Her behavior is normal. Judgment normal.    Female chaperone present for pelvic and breast  portions of the physical exam  Results: AUDIT Questionnaire (screen for alcoholism): 0 PHQ-9: 0   Assessment and Plan:  57 y.o. [phone] female here for routine annual gynecologic examination  Plan: Problem List Items Addressed This Visit    None      Visit Diagnoses    Women's annual routine gynecological examination    -  Primary   Screening for depression       Screening for alcoholism          Screening: -- Blood pressure screen normal -- Colonoscopy - not due -- Mammogram - due. Patient to call [facility] to arrange. She understands that it is her responsibility to arrange this. -- Weight screening: overweight: continue to monitor -- Depression screening negative (PHQ-9) -- Nutrition: normal -- cholesterol screening: per PCP -- osteoporosis screening: not due -- tobacco screening: not using -- alcohol screening: AUDIT questionnaire indicates low-risk usage. -- family history  of breast cancer screening: done. not at high risk. -- no evidence of domestic violence or intimate partner violence. -- STD screening: gonorrhea/chlamydia NAAT not collected per patient request. -- pap smear not collected per ASCCP guidelines -- HPV vaccination series: not eligilbe   Prentice Docker, MD 01/03/2018 9:54 AM

## 2018-01-09 ENCOUNTER — Other Ambulatory Visit: Payer: Self-pay

## 2018-01-09 NOTE — Progress Notes (Unsigned)
Entering pap results

## 2018-01-10 ENCOUNTER — Ambulatory Visit
Admission: RE | Admit: 2018-01-10 | Discharge: 2018-01-10 | Disposition: A | Discharge status: Home / Self Care | Payer: BLUE CROSS/BLUE SHIELD | Source: Ambulatory Visit | Attending: Obstetrics and Gynecology | Admitting: Obstetrics and Gynecology

## 2018-01-10 DIAGNOSIS — Z1231 Encounter for screening mammogram for malignant neoplasm of breast: Secondary | ICD-10-CM | POA: Insufficient documentation

## 2018-01-12 ENCOUNTER — Ambulatory Visit (INDEPENDENT_AMBULATORY_CARE_PROVIDER_SITE_OTHER): Payer: BLUE CROSS/BLUE SHIELD | Admitting: Family Medicine

## 2018-01-12 ENCOUNTER — Encounter: Payer: Self-pay | Admitting: Family Medicine

## 2018-01-12 VITALS — BP 120/62 | HR 76 | Ht 66.0 in | Wt 179.0 lb

## 2018-01-12 DIAGNOSIS — E7800 Pure hypercholesterolemia, unspecified: Secondary | ICD-10-CM | POA: Diagnosis not present

## 2018-01-12 DIAGNOSIS — D485 Neoplasm of uncertain behavior of skin: Secondary | ICD-10-CM | POA: Diagnosis not present

## 2018-01-12 DIAGNOSIS — C44529 Squamous cell carcinoma of skin of other part of trunk: Secondary | ICD-10-CM | POA: Diagnosis not present

## 2018-01-12 MED ORDER — PRAVASTATIN SODIUM 10 MG PO TABS: 10.0000 mg | ORAL_TABLET | Freq: Every day | ORAL | 3 refills | Status: DC

## 2018-01-12 NOTE — Progress Notes (Signed)
Name: Kerri Preston   MRN: [id number]    DOB: 07-08-1961   Date:01/12/2018       Progress Note  Subjective  Chief Complaint  Chief Complaint  Patient presents with  . Hyperlipidemia    Hyperlipidemia  This is a chronic problem. The current episode started more than 1 year ago. The problem is controlled. Recent lipid tests were reviewed and are normal. She has no history of chronic renal disease, diabetes, hypothyroidism, liver disease, obesity or nephrotic syndrome. There are no known factors aggravating her hyperlipidemia. Pertinent negatives include no chest pain, focal sensory loss, focal weakness, leg pain, myalgias or shortness of breath. Current antihyperlipidemic treatment includes statins. The current treatment provides moderate improvement of lipids. There are no compliance problems.  Risk factors for coronary artery disease include dyslipidemia.    No problem-specific Assessment & Plan notes found for this encounter.   Past Medical History:  Diagnosis Date  . Hyperlipidemia     Past Surgical History:  Procedure Laterality Date  . BILATERAL TEMPOROMANDIBULAR JOINT ARTHROPLASTY    . COLONOSCOPY  2012   cleared for 10 yrs  . DILATION AND CURETTAGE OF UTERUS    . TONSILLECTOMY    . WISDOM TOOTH EXTRACTION      Family History  Problem Relation Age of Onset  . Hypertension Father   . Breast cancer Paternal Aunt        90's  . Osteoarthritis Mother   . Osteoporosis Mother     Social History   Socioeconomic History  . Marital status: Married    Spouse name: Not on file  . Number of children: Not on file  . Years of education: Not on file  . Highest education level: Not on file  Occupational History  . Not on file  Social Needs  . Financial resource strain: Not on file  . Food insecurity:    Worry: Not on file    Inability: Not on file  . Transportation needs:    Medical: Not on file    Non-medical: Not on file  Tobacco Use  . Smoking status: Never Smoker   . Smokeless tobacco: Never Used  Substance and Sexual Activity  . Alcohol use: No    Alcohol/week: 0.0 oz  . Drug use: No  . Sexual activity: Yes  Lifestyle  . Physical activity:    Days per week: 6 days    Minutes per session: 50 min  . Stress: Not on file  Relationships  . Social connections:    Talks on phone: Not on file    Gets together: Not on file    Attends religious service: Not on file    Active member of club or organization: Not on file    Attends meetings of clubs or organizations: Not on file    Relationship status: Not on file  . Intimate partner violence:    Fear of current or ex partner: Not on file    Emotionally abused: Not on file    Physically abused: Not on file    Forced sexual activity: Not on file  Other Topics Concern  . Not on file  Social History Narrative  . Not on file    No Known Allergies  Outpatient Medications Prior to Visit  Medication Sig Dispense Refill  . albuterol (PROVENTIL HFA;VENTOLIN HFA) 108 (90 Base) MCG/ACT inhaler Inhale 2 puffs into the lungs every 6 (six) hours as needed for wheezing or shortness of breath. 1 Inhaler 2  .  pravastatin (PRAVACHOL) 10 MG tablet Take 1 tablet (10 mg total) by mouth daily. 15 tablet 0   No facility-administered medications prior to visit.     Review of Systems  Constitutional: Negative for chills, fever, malaise/fatigue and weight loss.  HENT: Negative for ear discharge, ear pain and sore throat.   Eyes: Negative for blurred vision.  Respiratory: Negative for cough, sputum production, shortness of breath and wheezing.   Cardiovascular: Negative for chest pain, palpitations and leg swelling.  Gastrointestinal: Negative for abdominal pain, blood in stool, constipation, diarrhea, heartburn, melena and nausea.  Genitourinary: Negative for dysuria, frequency, hematuria and urgency.  Musculoskeletal: Negative for back pain, joint pain, myalgias and neck pain.  Skin: Negative for rash.        Basal cell removal today  Neurological: Negative for dizziness, tingling, sensory change, focal weakness and headaches.  Endo/Heme/Allergies: Negative for environmental allergies and polydipsia. Does not bruise/bleed easily.  Psychiatric/Behavioral: Negative for depression and suicidal ideas. The patient is not nervous/anxious and does not have insomnia.      Objective  Vitals:   01/12/18 0827  BP: 120/62  Pulse: 76  Weight: 179 lb (81.2 kg)  Height: 5\' 6"  (1.676 m)    Physical Exam  Constitutional: No distress.  HENT:  Head: Normocephalic and atraumatic.  Right Ear: External ear normal.  Left Ear: External ear normal.  Nose: Nose normal.  Mouth/Throat: Oropharynx is clear and moist.  Eyes: Pupils are equal, round, and reactive to light. Conjunctivae and EOM are normal. Right eye exhibits no discharge. Left eye exhibits no discharge.  Neck: Normal range of motion. Neck supple. No JVD present. No thyromegaly present.  Cardiovascular: Normal rate, regular rhythm, normal heart sounds and intact distal pulses. Exam reveals no gallop and no friction rub.  No murmur heard. Pulmonary/Chest: Effort normal and breath sounds normal.  Abdominal: Soft. Bowel sounds are normal. She exhibits no mass. There is no tenderness. There is no guarding.  Musculoskeletal: Normal range of motion. She exhibits no edema.  Lymphadenopathy:    She has no cervical adenopathy.  Neurological: She is alert. She has normal reflexes.  Skin: Skin is warm and dry. She is not diaphoretic.      Assessment & Plan  Problem List Items Addressed This Visit    None    Visit Diagnoses    Pure hypercholesterolemia    -  Primary   Reviewed previous total/HDL ratios and need for medication and diet intervention.Controlled on pravastatin 10 mg and will check lipid panel today.   Relevant Medications   pravastatin (PRAVACHOL) 10 MG tablet   Other Relevant Orders   Lipid panel      Meds ordered this encounter   Medications  . pravastatin (PRAVACHOL) 10 MG tablet    Sig: Take 1 tablet (10 mg total) by mouth daily.    Dispense:  90 tablet    Refill:  3    Not being seen in over a year  Health risks of being over weight were discussed and patient was counseled on weight loss options and exercise.   Dr. [facility]  01/12/18

## 2018-01-12 NOTE — Patient Instructions (Signed)

## 2018-01-13 ENCOUNTER — Other Ambulatory Visit: Payer: Self-pay

## 2018-01-13 LAB — LIPID PANEL
Chol/HDL Ratio: 5 ratio — ABNORMAL HIGH (ref 0.0–4.4)
Cholesterol, Total: 239 mg/dL — ABNORMAL HIGH (ref 100–199)
HDL: 48 mg/dL (ref >39)
LDL Calculated: 143 mg/dL — ABNORMAL HIGH (ref 0–99)
Triglycerides: 238 mg/dL — ABNORMAL HIGH (ref 0–149)
VLDL Cholesterol Cal: 48 mg/dL — ABNORMAL HIGH (ref 5–40)

## 2018-01-13 MED ORDER — PRAVASTATIN SODIUM 20 MG PO TABS: 20.0000 mg | ORAL_TABLET | Freq: Every day | ORAL | 1 refills | Status: DC

## 2018-08-04 ENCOUNTER — Other Ambulatory Visit: Payer: Self-pay | Admitting: Family Medicine

## 2018-12-13 ENCOUNTER — Ambulatory Visit (INDEPENDENT_AMBULATORY_CARE_PROVIDER_SITE_OTHER): Payer: BLUE CROSS/BLUE SHIELD | Admitting: Family Medicine

## 2018-12-13 ENCOUNTER — Encounter: Payer: Self-pay | Admitting: Family Medicine

## 2018-12-13 ENCOUNTER — Other Ambulatory Visit: Payer: Self-pay

## 2018-12-13 VITALS — BP 120/80 | HR 72 | Ht 66.0 in | Wt 162.0 lb

## 2018-12-13 DIAGNOSIS — R69 Illness, unspecified: Secondary | ICD-10-CM

## 2018-12-13 DIAGNOSIS — E7800 Pure hypercholesterolemia, unspecified: Secondary | ICD-10-CM | POA: Diagnosis not present

## 2018-12-13 DIAGNOSIS — E663 Overweight: Secondary | ICD-10-CM | POA: Diagnosis not present

## 2018-12-13 MED ORDER — PRAVASTATIN SODIUM 20 MG PO TABS: 20.0000 mg | ORAL_TABLET | Freq: Every day | ORAL | 1 refills | Status: DC

## 2018-12-13 NOTE — Patient Instructions (Signed)

## 2018-12-13 NOTE — Progress Notes (Addendum)
Date:  12/13/2018   Name:  Kerri Preston   DOB:  06/12/1961   MRN:  [id number]   Chief Complaint: Hyperlipidemia  Hyperlipidemia  This is a chronic problem. The current episode started more than 1 year ago. The problem is controlled. Recent lipid tests were reviewed and are normal. She has no history of chronic renal disease, diabetes, hypothyroidism, liver disease, obesity or nephrotic syndrome. There are no known factors aggravating her hyperlipidemia. Pertinent negatives include no chest pain, focal sensory loss, focal weakness, leg pain, myalgias or shortness of breath. Current antihyperlipidemic treatment includes statins. The current treatment provides moderate improvement of lipids. There are no compliance problems.  Risk factors for coronary artery disease include dyslipidemia and post-menopausal.    Review of Systems  Constitutional: Negative.  Negative for chills, fatigue, fever and unexpected weight change.  HENT: Negative for congestion, ear discharge, ear pain, rhinorrhea, sinus pressure, sneezing and sore throat.   Eyes: Negative for photophobia, pain, discharge, redness and itching.  Respiratory: Negative for cough, shortness of breath, wheezing and stridor.   Cardiovascular: Negative for chest pain.  Gastrointestinal: Negative for abdominal pain, blood in stool, constipation, diarrhea, nausea and vomiting.  Endocrine: Negative for cold intolerance, heat intolerance, polydipsia, polyphagia and polyuria.  Genitourinary: Negative for dysuria, flank pain, frequency, hematuria, menstrual problem, pelvic pain, urgency, vaginal bleeding and vaginal discharge.  Musculoskeletal: Negative for arthralgias, back pain and myalgias.  Skin: Negative for rash.  Allergic/Immunologic: Negative for environmental allergies and food allergies.  Neurological: Negative for dizziness, focal weakness, weakness, light-headedness, numbness and headaches.  Hematological: Negative for adenopathy. Does  not bruise/bleed easily.  Psychiatric/Behavioral: Negative for dysphoric mood. The patient is not nervous/anxious.     Patient Active Problem List   Diagnosis Date Noted  . Reactive airway disease, mild intermittent, with acute exacerbation 09/30/2016    No Known Allergies  Past Surgical History:  Procedure Laterality Date  . BILATERAL TEMPOROMANDIBULAR JOINT ARTHROPLASTY    . COLONOSCOPY  2012   cleared for 10 yrs  . DILATION AND CURETTAGE OF UTERUS    . TONSILLECTOMY    . WISDOM TOOTH EXTRACTION      Social History   Tobacco Use  . Smoking status: Never Smoker  . Smokeless tobacco: Never Used  Substance Use Topics  . Alcohol use: No    Alcohol/week: 0.0 standard drinks  . Drug use: No     Medication list has been reviewed and updated.  Current Meds  Medication Sig  . albuterol (PROVENTIL HFA;VENTOLIN HFA) 108 (90 Base) MCG/ACT inhaler Inhale 2 puffs into the lungs every 6 (six) hours as needed for wheezing or shortness of breath.  . pravastatin (PRAVACHOL) 20 MG tablet TAKE 1 TABLET BY MOUTH DAILY    PHQ 2/9 Scores 12/13/2018 01/12/2018 01/03/2018 10/20/2015  PHQ - 2 Score 0 0 0 0  PHQ- 9 Score 0 0 0 -    BP Readings from Last 3 Encounters:  12/13/18 120/80  01/12/18 120/62  01/03/18 110/70    Physical Exam Vitals signs and nursing note reviewed.  Constitutional:      General: She is not in acute distress.    Appearance: She is not diaphoretic.  HENT:     Head: Normocephalic and atraumatic.     Right Ear: External ear normal.     Left Ear: External ear normal.     Nose: Nose normal.  Eyes:     General:        Right eye:  No discharge.        Left eye: No discharge.     Conjunctiva/sclera: Conjunctivae normal.     Pupils: Pupils are equal, round, and reactive to light.  Neck:     Musculoskeletal: Normal range of motion and neck supple.     Thyroid: No thyromegaly.     Vascular: No JVD.  Cardiovascular:     Rate and Rhythm: Normal rate and regular  rhythm.     Heart sounds: Normal heart sounds. No murmur. No friction rub. No gallop.   Pulmonary:     Effort: Pulmonary effort is normal.     Breath sounds: Normal breath sounds.  Abdominal:     General: Bowel sounds are normal.     Palpations: Abdomen is soft. There is no mass.     Tenderness: There is no abdominal tenderness. There is no guarding.  Musculoskeletal: Normal range of motion.  Lymphadenopathy:     Cervical: No cervical adenopathy.  Skin:    General: Skin is warm and dry.  Neurological:     Mental Status: She is alert.     Deep Tendon Reflexes: Reflexes are normal and symmetric.     Wt Readings from Last 3 Encounters:  12/13/18 162 lb (73.5 kg)  01/12/18 179 lb (81.2 kg)  01/03/18 177 lb (80.3 kg)    BP 120/80   Pulse 72   Ht 5\' 6"  (1.676 m)   Wt 162 lb (73.5 kg)   BMI 26.15 kg/m   Assessment and Plan:  1. Pure hypercholesterolemia Chronic.  Controlled.  Continue pravastatin 20 mg once a day.  Will check lipid panel for evaluation of status. - pravastatin (PRAVACHOL) 20 MG tablet; Take 1 tablet (20 mg total) by mouth daily.  Dispense: 90 tablet; Refill: 1 - Lipid Panel With LDL/HDL Ratio  2. Taking medication for chronic disease Patient is taking a statin for which we will check hepatic function to rule out any hepatotoxicity from medication.  Patient is not having any issues with myalgias. - Hepatic function panel  3. Overweight (BMI 25.0-29.9) Patient is doing excellent on her weight loss with her diet.  Is been unable to attend weight watchers secondary to COVID virus circumstance.  Patient was given cholesterol sheet and was encouraged to continue her successful decline.

## 2018-12-14 LAB — LIPID PANEL WITH LDL/HDL RATIO
Cholesterol, Total: 218 mg/dL — ABNORMAL HIGH (ref 100–199)
HDL: 62 mg/dL (ref >39)
LDL Calculated: 128 mg/dL — ABNORMAL HIGH (ref 0–99)
LDl/HDL Ratio: 2.1 ratio (ref 0.0–3.2)
Triglycerides: 141 mg/dL (ref 0–149)
VLDL Cholesterol Cal: 28 mg/dL (ref 5–40)

## 2018-12-14 LAB — HEPATIC FUNCTION PANEL
ALT: 22 IU/L (ref 0–32)
AST: 18 IU/L (ref 0–40)
Albumin: 4.6 g/dL (ref 3.8–4.9)
Alkaline Phosphatase: 75 IU/L (ref 39–117)
Bilirubin Total: 0.8 mg/dL (ref 0.0–1.2)
Bilirubin, Direct: 0.2 mg/dL (ref 0.00–0.40)
Total Protein: 7.2 g/dL (ref 6.0–8.5)

## 2019-06-25 ENCOUNTER — Other Ambulatory Visit: Payer: Self-pay

## 2019-06-25 DIAGNOSIS — E7800 Pure hypercholesterolemia, unspecified: Secondary | ICD-10-CM

## 2019-06-25 MED ORDER — PRAVASTATIN SODIUM 20 MG PO TABS
20.0000 mg | ORAL_TABLET | Freq: Every day | ORAL | 0 refills | Status: DC
Start: 2019-06-25 — End: 2019-07-17

## 2019-07-16 ENCOUNTER — Ambulatory Visit: Payer: BLUE CROSS/BLUE SHIELD | Admitting: Family Medicine

## 2019-07-17 ENCOUNTER — Ambulatory Visit (INDEPENDENT_AMBULATORY_CARE_PROVIDER_SITE_OTHER): Payer: BC Managed Care – PPO | Admitting: Family Medicine

## 2019-07-17 ENCOUNTER — Encounter: Payer: Self-pay | Admitting: Family Medicine

## 2019-07-17 ENCOUNTER — Other Ambulatory Visit: Payer: Self-pay

## 2019-07-17 VITALS — BP 126/62 | HR 72 | Ht 66.0 in | Wt 174.0 lb

## 2019-07-17 DIAGNOSIS — E7800 Pure hypercholesterolemia, unspecified: Secondary | ICD-10-CM

## 2019-07-17 DIAGNOSIS — Z23 Encounter for immunization: Secondary | ICD-10-CM

## 2019-07-17 MED ORDER — PRAVASTATIN SODIUM 20 MG PO TABS: 20.0000 mg | ORAL_TABLET | Freq: Every day | ORAL | 1 refills | Status: DC

## 2019-07-17 NOTE — Patient Instructions (Signed)

## 2019-07-17 NOTE — Progress Notes (Signed)
Date:  07/17/2019   Name:  Kerri Preston   DOB:  April 17, 1961   MRN:  [id number]   Chief Complaint: Hyperlipidemia (refill lipid med) and Flu Vaccine  Hyperlipidemia This is a chronic problem. The current episode started more than 1 year ago. The problem is controlled. Recent lipid tests were reviewed and are normal. She has no history of chronic renal disease, diabetes, hypothyroidism, liver disease, obesity or nephrotic syndrome. There are no known factors aggravating her hyperlipidemia. Pertinent negatives include no chest pain, focal sensory loss, focal weakness, leg pain, myalgias or shortness of breath. Current antihyperlipidemic treatment includes statins. The current treatment provides moderate improvement of lipids. There are no compliance problems.  Risk factors for coronary artery disease include dyslipidemia.    No results found for: CREATININE, BUN, NA, K, CL, CO2 Lab Results  Component Value Date   CHOL 218 (H) 12/13/2018   HDL 62 12/13/2018   LDLCALC 128 (H) 12/13/2018   TRIG 141 12/13/2018   CHOLHDL 5.0 (H) 01/12/2018   No results found for: TSH No results found for: HGBA1C   Review of Systems  Constitutional: Negative.  Negative for chills, fatigue, fever and unexpected weight change.  HENT: Negative for congestion, ear discharge, ear pain, rhinorrhea, sinus pressure, sneezing and sore throat.   Eyes: Negative for photophobia, pain, discharge, redness and itching.  Respiratory: Negative for cough, shortness of breath, wheezing and stridor.   Cardiovascular: Negative for chest pain.  Gastrointestinal: Negative for abdominal pain, blood in stool, constipation, diarrhea, nausea and vomiting.  Endocrine: Negative for cold intolerance, heat intolerance, polydipsia, polyphagia and polyuria.  Genitourinary: Negative for dysuria, flank pain, frequency, hematuria, menstrual problem, pelvic pain, urgency, vaginal bleeding and vaginal discharge.  Musculoskeletal: Negative  for arthralgias, back pain, gait problem, joint swelling, myalgias, neck pain and neck stiffness.  Skin: Negative for color change, pallor and rash.  Allergic/Immunologic: Negative for environmental allergies and food allergies.  Neurological: Negative for dizziness, focal weakness, weakness, light-headedness, numbness and headaches.  Hematological: Negative for adenopathy. Does not bruise/bleed easily.  Psychiatric/Behavioral: Negative for dysphoric mood. The patient is not nervous/anxious.     Patient Active Problem List   Diagnosis Date Noted  . Reactive airway disease, mild intermittent, with acute exacerbation 09/30/2016    No Known Allergies  Past Surgical History:  Procedure Laterality Date  . BILATERAL TEMPOROMANDIBULAR JOINT ARTHROPLASTY    . COLONOSCOPY  2012   cleared for 10 yrs  . DILATION AND CURETTAGE OF UTERUS    . TONSILLECTOMY    . WISDOM TOOTH EXTRACTION      Social History   Tobacco Use  . Smoking status: Never Smoker  . Smokeless tobacco: Never Used  Substance Use Topics  . Alcohol use: No    Alcohol/week: 0.0 standard drinks  . Drug use: No     Medication list has been reviewed and updated.  Current Meds  Medication Sig  . pravastatin (PRAVACHOL) 20 MG tablet Take 1 tablet (20 mg total) by mouth daily.  . [DISCONTINUED] albuterol (PROVENTIL HFA;VENTOLIN HFA) 108 (90 Base) MCG/ACT inhaler Inhale 2 puffs into the lungs every 6 (six) hours as needed for wheezing or shortness of breath.    PHQ 2/9 Scores 07/17/2019 12/13/2018 01/12/2018 01/03/2018  PHQ - 2 Score 0 0 0 0  PHQ- 9 Score 0 0 0 0    BP Readings from Last 3 Encounters:  07/17/19 126/62  12/13/18 120/80  01/12/18 120/62    Physical Exam  Wt  Readings from Last 3 Encounters:  07/17/19 174 lb (78.9 kg)  12/13/18 162 lb (73.5 kg)  01/12/18 179 lb (81.2 kg)    BP 126/62   Pulse 72   Ht 5\' 6"  (1.676 m)   Wt 174 lb (78.9 kg)   BMI 28.08 kg/m   Assessment and Plan:  1. Pure  hypercholesterolemia Chronic.  Controlled.  Stable.  Will continue pravastatin 20 mg at this time but we will check a lipid and we perhaps may go up to 40 mg pending LDL. - Lipid Panel With LDL/HDL Ratio - pravastatin (PRAVACHOL) 20 MG tablet; Take 1 tablet (20 mg total) by mouth daily.  Dispense: 90 tablet; Refill: 1  2. Flu vaccine need Discussed and administered - Flu Vaccine QUAD 6+ mos PF IM (Fluarix Quad PF)

## 2019-07-18 LAB — LIPID PANEL WITH LDL/HDL RATIO
Cholesterol, Total: 215 mg/dL — ABNORMAL HIGH (ref 100–199)
HDL: 63 mg/dL (ref >39)
LDL Chol Calc (NIH): 126 mg/dL — ABNORMAL HIGH (ref 0–99)
LDL/HDL Ratio: 2 ratio (ref 0.0–3.2)
Triglycerides: 149 mg/dL (ref 0–149)
VLDL Cholesterol Cal: 26 mg/dL (ref 5–40)

## 2019-08-15 ENCOUNTER — Ambulatory Visit: Payer: BC Managed Care – PPO | Attending: Internal Medicine

## 2019-08-15 DIAGNOSIS — Z20822 Contact with and (suspected) exposure to covid-19: Secondary | ICD-10-CM

## 2019-08-16 LAB — NOVEL CORONAVIRUS, NAA: SARS-CoV-2, NAA: NOT DETECTED

## 2020-01-25 ENCOUNTER — Other Ambulatory Visit: Payer: Self-pay | Admitting: Family Medicine

## 2020-01-25 DIAGNOSIS — E7800 Pure hypercholesterolemia, unspecified: Secondary | ICD-10-CM

## 2020-05-14 ENCOUNTER — Telehealth: Payer: Self-pay

## 2020-05-14 NOTE — Telephone Encounter (Unsigned)
Copied from [place] [phone]. Topic: General - Inquiry >> May 14, 2020  3:57 PM Alease Frame wrote: Reason for CRM:pt is wanting a call back from nurse for a medical condition. Please advise

## 2020-05-15 NOTE — Telephone Encounter (Signed)
Spoke to pt concerning husband

## 2020-05-30 ENCOUNTER — Other Ambulatory Visit: Payer: Self-pay

## 2020-05-30 ENCOUNTER — Ambulatory Visit (INDEPENDENT_AMBULATORY_CARE_PROVIDER_SITE_OTHER): Payer: BC Managed Care – PPO | Admitting: Obstetrics and Gynecology

## 2020-05-30 ENCOUNTER — Telehealth: Payer: Self-pay

## 2020-05-30 ENCOUNTER — Encounter: Payer: Self-pay | Admitting: Obstetrics and Gynecology

## 2020-05-30 VITALS — BP 110/70 | Ht 66.0 in | Wt 178.0 lb

## 2020-05-30 DIAGNOSIS — Z1239 Encounter for other screening for malignant neoplasm of breast: Secondary | ICD-10-CM

## 2020-05-30 DIAGNOSIS — N644 Mastodynia: Secondary | ICD-10-CM | POA: Diagnosis not present

## 2020-05-30 NOTE — Telephone Encounter (Signed)
Pt calling today c/o left breast sharp burning pain. Has annual 11/10, wants to be seen sooner. Are there any openings for breast exam?

## 2020-05-30 NOTE — Progress Notes (Signed)
Obstetrics & Gynecology Office Visit   Chief Complaint:  Chief Complaint  Patient presents with  . Breast exam    pain and burning on LB    History of Present Illness: 59 y.o. [id number] postmenopausal female with one day history of sharp burning left breast pain radiating from the left axilla into the upper out quadrant of the left breast.  No inciting events or trauma to correlate with onset of symptoms.  No currently on HRT.  Denies associated skin changes or nipple discharge.  Mammogram due but last mammogram 01/10/2018 BI-RAD I.  No family history of breast cancer other than in one aunt.    Review of Systems: Review of Systems  Constitutional: Negative.   Cardiovascular: Positive for chest pain. Negative for palpitations, orthopnea, claudication, leg swelling and PND.  Skin: Negative.      Past Medical History:  Past Medical History:  Diagnosis Date  . Hyperlipidemia     Past Surgical History:  Past Surgical History:  Procedure Laterality Date  . BILATERAL TEMPOROMANDIBULAR JOINT ARTHROPLASTY    . COLONOSCOPY  2012   cleared for 10 yrs  . DILATION AND CURETTAGE OF UTERUS    . TONSILLECTOMY    . WISDOM TOOTH EXTRACTION      Gynecologic History: No LMP recorded. Patient is postmenopausal.  Obstetric History: [id number]  Family History:  Family History  Problem Relation Age of Onset  . Hypertension Father   . Breast cancer Paternal Aunt        65's  . Osteoarthritis Mother   . Osteoporosis Mother     Social History:  Social History   Socioeconomic History  . Marital status: Married    Spouse name: Not on file  . Number of children: Not on file  . Years of education: Not on file  . Highest education level: Not on file  Occupational History  . Not on file  Tobacco Use  . Smoking status: Never Smoker  . Smokeless tobacco: Never Used  Vaping Use  . Vaping Use: Never used  Substance and Sexual Activity  . Alcohol use: No    Alcohol/week: 0.0 standard  drinks  . Drug use: No  . Sexual activity: Yes    Birth control/protection: Post-menopausal  Other Topics Concern  . Not on file  Social History Narrative  . Not on file   Social Determinants of Health   Financial Resource Strain:   . Difficulty of Paying Living Expenses: Not on file  Food Insecurity:   . Worried About Charity fundraiser in the Last Year: Not on file  . Ran Out of Food in the Last Year: Not on file  Transportation Needs:   . Lack of Transportation (Medical): Not on file  . Lack of Transportation (Non-Medical): Not on file  Physical Activity:   . Days of Exercise per Week: Not on file  . Minutes of Exercise per Session: Not on file  Stress:   . Feeling of Stress : Not on file  Social Connections:   . Frequency of Communication with Friends and Family: Not on file  . Frequency of Social Gatherings with Friends and Family: Not on file  . Attends Religious Services: Not on file  . Active Member of Clubs or Organizations: Not on file  . Attends Archivist Meetings: Not on file  . Marital Status: Not on file  Intimate Partner Violence:   . Fear of Current or Ex-Partner: Not on file  .  Emotionally Abused: Not on file  . Physically Abused: Not on file  . Sexually Abused: Not on file    Allergies:  No Known Allergies  Medications: Prior to Admission medications   Medication Sig Start Date End Date Taking? Authorizing Provider  pravastatin (PRAVACHOL) 20 MG tablet TAKE 1 TABLET BY MOUTH EVERY DAY 01/25/20  Yes Juline Patch, MD    Physical Exam Vitals:  Vitals:   05/30/20 1503  BP: 110/70   No LMP recorded. Patient is postmenopausal.  General: NAD, well nourished, appear stated age 40: normocephalic, anicteric Breast symmetrical, no tenderness, no palpable nodules or masses, no skin or nipple retraction present, no nipple discharge.  No axillary or supraclavicular lymphadenopathy. Pulmonary: No increased work of breathing Extremities:  no edema, erythema, or tenderness Neurologic: Grossly intact Psychiatric: mood appropriate, affect full  Female chaperone present for pelvic  portions of the physical exam  Assessment: 59 y.o. [id number] left mastodynia  Plan: Problem List Items Addressed This Visit    None    Visit Diagnoses    Mastodynia of left breast    -  Primary   Relevant Orders   MM DIAG BREAST TOMO BILATERAL   US BREAST COMPLETE UNI LEFT INC AXILLA   US BREAST COMPLETE UNI RIGHT INC AXILLA   Breast screening       Relevant Orders   MM DIAG BREAST TOMO BILATERAL   US BREAST COMPLETE UNI LEFT INC AXILLA   US BREAST COMPLETE UNI RIGHT INC AXILLA     1) New onset left mastodynia - Diagnostic mammogram and ultrasound  - Left breast pain and tenderness. Radiating from the axilla into the breast - Normal exam no masses, no axillary or supraclavicular adenopathy - No overlying skin changes - no trauma - Discussed NSAID use, appropriate fitting bra preferably without under wire, warm compresses   2) A total of 15 minutes were spent in face-to-face contact with the patient during this encounter with over half of that time devoted to counseling and coordination of care.  3) Annual membane 11/10  Malachy Mood, MD, [facility], [place]

## 2020-05-30 NOTE — Telephone Encounter (Signed)
Patient is scheduled for 05/30/20 at 2:50 with AMS

## 2020-06-05 NOTE — Addendum Note (Signed)
Addended by: Ardeth Perfect B on: [id number]/7671 01:50 PM   Modules accepted: Orders

## 2020-06-11 ENCOUNTER — Encounter: Payer: Self-pay | Admitting: Advanced Practice Midwife

## 2020-06-11 ENCOUNTER — Ambulatory Visit (INDEPENDENT_AMBULATORY_CARE_PROVIDER_SITE_OTHER): Payer: BC Managed Care – PPO | Admitting: Advanced Practice Midwife

## 2020-06-11 ENCOUNTER — Other Ambulatory Visit: Payer: Self-pay

## 2020-06-11 VITALS — BP 120/82 | Ht 66.0 in | Wt 181.0 lb

## 2020-06-11 DIAGNOSIS — Z0000 Encounter for general adult medical examination without abnormal findings: Secondary | ICD-10-CM

## 2020-06-11 NOTE — Progress Notes (Signed)
Gynecology Annual Exam  PCP: Juline Patch, MD  Chief Complaint:  Chief Complaint  Patient presents with  . Gynecologic Exam  . Injections    flu    History of Present Illness:Patient is a 59 y.o. [id number] presents for annual exam. The patient has no gyn complaints today. She was seen 2 weeks ago for breast pain and is scheduled for diagnostic mammogram next week. The pain has decreased some since she was seen. She denies any breast lumps.  She mentions concerns for weight gain and difficulty sleeping. She admits to ongoing stressful personal circumstances as the primary cause. She does try to eat healthy and admits adequate hydration. She also admits stress eating. She walks about 2 miles 5 days per week and is otherwise sedentary at home and work. She only sleeps about 4 hours per night. We discussed post menopausal changes, components of a healthy lifestyle and good sleep hygiene.   LMP: No LMP recorded. Patient is postmenopausal.  Postcoital Bleeding: no  The patient is sexually active. She denies dyspareunia.  The patient does perform self breast exams.  There is no notable family history of breast or ovarian cancer in her family.  The patient wears seatbelts: yes.      The patient reports some symptoms of depression due to circumstances. She does not take medication.      Review of Systems: Review of Systems  Constitutional: Negative for chills and fever.       Positive for weight gain and inability to stay asleep  HENT: Negative for congestion, ear discharge, ear pain, hearing loss, sinus pain and sore throat.   Eyes: Negative for blurred vision and double vision.  Respiratory: Negative for cough, shortness of breath and wheezing.   Cardiovascular: Negative for chest pain, palpitations and leg swelling.  Gastrointestinal: Negative for abdominal pain, blood in stool, constipation, diarrhea, heartburn, melena, nausea and vomiting.  Genitourinary: Negative for dysuria,  flank pain, frequency, hematuria and urgency.  Musculoskeletal: Negative for back pain, joint pain and myalgias.  Skin: Negative for itching and rash.  Neurological: Negative for dizziness, tingling, tremors, sensory change, speech change, focal weakness, seizures, loss of consciousness, weakness and headaches.  Endo/Heme/Allergies: Negative for environmental allergies. Does not bruise/bleed easily.  Psychiatric/Behavioral: Positive for depression. Negative for hallucinations, memory loss, substance abuse and suicidal ideas. The patient is not nervous/anxious and does not have insomnia.   Breast: Positive for left breast pain  Past Medical History:  Patient Active Problem List   Diagnosis Date Noted  . Reactive airway disease, mild intermittent, with acute exacerbation 09/30/2016    symbicort     Past Surgical History:  Past Surgical History:  Procedure Laterality Date  . BILATERAL TEMPOROMANDIBULAR JOINT ARTHROPLASTY    . COLONOSCOPY  2012   cleared for 10 yrs  . DILATION AND CURETTAGE OF UTERUS    . TONSILLECTOMY    . WISDOM TOOTH EXTRACTION      Gynecologic History:  No LMP recorded. Patient is postmenopausal. Last Pap: 4 years ago Results were:  no abnormalities  Last mammogram: 2 years ago Results were: BI-RAD I  Obstetric History: [id number]  Family History:  Family History  Problem Relation Age of Onset  . Hypertension Father   . Breast cancer Paternal Aunt        69's  . Osteoarthritis Mother   . Osteoporosis Mother     Social History:  Social History   Socioeconomic History  . Marital status: Married  Spouse name: Not on file  . Number of children: Not on file  . Years of education: Not on file  . Highest education level: Not on file  Occupational History  . Not on file  Tobacco Use  . Smoking status: Never Smoker  . Smokeless tobacco: Never Used  Vaping Use  . Vaping Use: Never used  Substance and Sexual Activity  . Alcohol use: No     Alcohol/week: 0.0 standard drinks  . Drug use: No  . Sexual activity: Yes    Birth control/protection: Post-menopausal  Other Topics Concern  . Not on file  Social History Narrative  . Not on file   Social Determinants of Health   Financial Resource Strain:   . Difficulty of Paying Living Expenses: Not on file  Food Insecurity:   . Worried About Charity fundraiser in the Last Year: Not on file  . Ran Out of Food in the Last Year: Not on file  Transportation Needs:   . Lack of Transportation (Medical): Not on file  . Lack of Transportation (Non-Medical): Not on file  Physical Activity:   . Days of Exercise per Week: Not on file  . Minutes of Exercise per Session: Not on file  Stress:   . Feeling of Stress : Not on file  Social Connections:   . Frequency of Communication with Friends and Family: Not on file  . Frequency of Social Gatherings with Friends and Family: Not on file  . Attends Religious Services: Not on file  . Active Member of Clubs or Organizations: Not on file  . Attends Archivist Meetings: Not on file  . Marital Status: Not on file  Intimate Partner Violence:   . Fear of Current or Ex-Partner: Not on file  . Emotionally Abused: Not on file  . Physically Abused: Not on file  . Sexually Abused: Not on file    Allergies:  No Known Allergies  Medications: Prior to Admission medications   Medication Sig Start Date End Date Taking? Authorizing Provider  pravastatin (PRAVACHOL) 20 MG tablet TAKE 1 TABLET BY MOUTH EVERY DAY 01/25/20  Yes Juline Patch, MD    Physical Exam Vitals: Blood pressure 120/82, height 5\' 6"  (1.676 m), weight 181 lb (82.1 kg).  General: NAD HEENT: normocephalic, anicteric Thyroid: no enlargement, no palpable nodules Pulmonary: No increased work of breathing, CTAB Cardiovascular: RRR, distal pulses 2+ Breast: Breast symmetrical, no tenderness, no palpable nodules or masses, no skin or nipple retraction present, no nipple  discharge.  No axillary or supraclavicular lymphadenopathy. Abdomen: NABS, soft, non-tender, non-distended.  Umbilicus without lesions.  No hepatomegaly, splenomegaly or masses palpable. No evidence of hernia  Genitourinary: deferred for no concerns/PAP interval Extremities: no edema, erythema, or tenderness Neurologic: Grossly intact Psychiatric: mood appropriate, affect full     Assessment: 59 y.o. [id number] routine annual exam  Plan: Problem List Items Addressed This Visit    None    Visit Diagnoses    Well woman exam without gynecological exam    -  Primary      1) Mammogram - recommend yearly screening mammogram.  Mammogram is scheduled for next week  2) STI screening  was not offered and therefore not obtained  3) ASCCP guidelines and rationale discussed.  Patient opts for every 5 years screening interval  4) Osteoporosis  - per USPTF routine screening DEXA at age 81  Consider FDA-approved medical therapies in postmenopausal women and men aged 66 years and older, based  on the following: a) A hip or vertebral (clinical or morphometric) fracture b) T-score ? -2.5 at the femoral neck or spine after appropriate evaluation to exclude secondary causes C) Low bone mass (T-score between -1.0 and -2.5 at the femoral neck or spine) and a 10-year probability of a hip fracture ? 3% or a 10-year probability of a major osteoporosis-related fracture ? 20% based on the [place]-adapted WHO algorithm   5) Routine healthcare maintenance including cholesterol, diabetes screening discussed managed by PCP  6) Colonoscopy is due next year.  Screening recommended starting at age 35 for average risk individuals, age 35 for individuals deemed at increased risk (including African Americans) and recommended to continue until age 27.  For patient age 63-85 individualized approach is recommended.  Gold standard screening is via colonoscopy, Cologuard screening is an acceptable alternative for patient unwilling  or unable to undergo colonoscopy.  "Colorectal cancer screening for average?risk adults: 2018 guideline update from the American Cancer Society"CA: A Cancer Journal for Clinicians: Dec 29, 2016   7) Increase healthy lifestyle: Relaxation/stress relieving activity, physical activity, fresh air/sunshine, sleep (avoid screen time before bed, limit vigorous activity before bed, limit caffeine to AM, increase sunshine for melatonin conversion, chamomile tea, lavender essential oil). Reduce calorie intake to healthy level  8) Return in about 1 year (around 06/11/2021) for annual established gyn.    Christean Leaf, CNM [facility] 06/11/20, 3:28 PM

## 2020-06-11 NOTE — Patient Instructions (Signed)
Managing Stress, Adult Feeling a certain amount of stress is normal. Stress helps our body and mind get ready to deal with the demands of life. Stress hormones can motivate you to do well at work and meet your responsibilities. However severe or long-lasting (chronic) stress can affect your mental and physical health. Chronic stress puts you at higher risk for anxiety, depression, and other health problems like digestive problems, muscle aches, heart disease, high blood pressure, and stroke. What are the causes? Common causes of stress include:  Demands from work, such as deadlines, feeling overworked, or having long hours.  Pressures at home, such as money issues, disagreements with a spouse, or parenting issues.  Pressures from major life changes, such as divorce, moving, loss of a loved one, or chronic illness. You may be at higher risk for stress-related problems if you do not get enough sleep, are in poor health, do not have emotional support, or have a mental health disorder like anxiety or depression. How to recognize stress Stress can make you:  Have trouble sleeping.  Feel sad, anxious, irritable, or overwhelmed.  Lose your appetite.  Overeat or want to eat unhealthy foods.  Want to use drugs or alcohol. Stress can also cause physical symptoms, such as:  Sore, tense muscles, especially in the shoulders and neck.  Headaches.  Trouble breathing.  A faster heart rate.  Stomach pain, nausea, or vomiting.  Diarrhea or constipation.  Trouble concentrating. Follow these instructions at home: Lifestyle  Identify the source of your stress and your reaction to it. See a therapist who can help you change your reactions.  When there are stressful events: ? Talk about it with family, friends, or co-workers. ? Try to think realistically about stressful events and not ignore them or overreact. ? Try to find the positives in a stressful situation and not focus on the  negatives. ? Cut back on responsibilities at work and home, if possible. Ask for help from friends or family members if you need it.  Find ways to cope with stress, such as: ? Meditation. ? Deep breathing. ? Yoga or tai chi. ? Progressive muscle relaxation. ? Doing art, playing music, or reading. ? Making time for fun activities. ? Spending time with family and friends.  Get support from family, friends, or spiritual resources. Eating and drinking  Eat a healthy diet. This includes: ? Eating foods that are high in fiber, such as beans, whole grains, and fresh fruits and vegetables. ? Limiting foods that are high in fat and processed sugars, such as fried and sweet foods.  Do not skip meals or overeat.  Drink enough fluid to keep your urine pale yellow. Alcohol use  Do not drink alcohol if: ? Your health care provider tells you not to drink. ? You are pregnant, may be pregnant, or are planning to become pregnant.  Drinking alcohol is a way some people try to ease their stress. This can be dangerous, so if you drink alcohol: ? Limit how much you use to:  0-1 drink a day for women.  0-2 drinks a day for men. ? Be aware of how much alcohol is in your drink. In the [place] one drink equals one 12 oz bottle of beer (355 mL), one 5 oz glass of wine (148 mL), or one 1 oz glass of hard liquor (44 mL). Activity   Include 30 minutes of exercise in your daily schedule. Exercise is a good stress reducer.  Include time in your day   for an activity that you find relaxing. Try taking a walk, going on a bike ride, reading a book, or listening to music.  Schedule your time in a way that lowers stress, and keep a consistent schedule. Prioritize what is most important to get done. General instructions  Get enough sleep. Try to go to sleep and get up at about the same time every day.  Take over-the-counter and prescription medicines only as told by your health care provider.  Do not use any  products that contain nicotine or tobacco, such as cigarettes, e-cigarettes, and chewing tobacco. If you need help quitting, ask your health care provider.  Do not use drugs or smoke to cope with stress.  Keep all follow-up visits as told by your health care provider. This is important. Where to find support  Talk with your health care provider about stress management or finding a support group.  Find a therapist to work with you on your stress management techniques. Contact a health care provider if:  Your stress symptoms get worse.  You are unable to manage your stress at home.  You are struggling to stop using drugs or alcohol. Get help right away if:  You may be a danger to yourself or others.  You have any thoughts of death or suicide. If you ever feel like you may hurt yourself or others, or have thoughts about taking your own life, get help right away. You can go to your nearest emergency department or call:  Your local emergency services (911 in the [place]  A suicide crisis helpline, such as the [place] at 786-499-0900. This is open 24 hours a day. Summary  Feeling a certain amount of stress is normal, but severe or long-lasting (chronic) stress can affect your mental and physical health.  Chronic stress can put you at higher risk for anxiety, depression, and other health problems like digestive problems, muscle aches, heart disease, high blood pressure, and stroke.  You may be at higher risk for stress-related problems if you do not get enough sleep, are in poor health, lack emotional support, or have a mental health disorder like anxiety or depression.  Identify the source of your stress and your reaction to it. Try talking about stressful events with family, friends, or co-workers, finding a coping method, or getting support from spiritual resources.  If you need more help, talk with your health care provider about finding a support group  or a mental health therapist. This information is not intended to replace advice given to you by your health care provider. Make sure you discuss any questions you have with your health care provider. Document Revised: 02/14/2019 Document Reviewed: 02/14/2019 Elsevier Patient Education  [place].

## 2020-06-18 ENCOUNTER — Ambulatory Visit
Admission: RE | Admit: 2020-06-18 | Discharge: 2020-06-18 | Disposition: A | Discharge status: Home / Self Care | Payer: BC Managed Care – PPO | Source: Ambulatory Visit | Attending: Obstetrics and Gynecology | Admitting: Obstetrics and Gynecology

## 2020-06-18 ENCOUNTER — Other Ambulatory Visit: Payer: Self-pay

## 2020-06-18 DIAGNOSIS — N644 Mastodynia: Secondary | ICD-10-CM

## 2020-06-18 DIAGNOSIS — Z1239 Encounter for other screening for malignant neoplasm of breast: Secondary | ICD-10-CM | POA: Diagnosis not present

## 2020-07-11 ENCOUNTER — Telehealth: Payer: Self-pay

## 2020-07-11 ENCOUNTER — Ambulatory Visit (INDEPENDENT_AMBULATORY_CARE_PROVIDER_SITE_OTHER): Payer: BC Managed Care – PPO | Admitting: Family Medicine

## 2020-07-11 ENCOUNTER — Encounter: Payer: Self-pay | Admitting: Family Medicine

## 2020-07-11 VITALS — Temp 99.7°F

## 2020-07-11 DIAGNOSIS — R059 Cough, unspecified: Secondary | ICD-10-CM | POA: Diagnosis not present

## 2020-07-11 DIAGNOSIS — J0100 Acute maxillary sinusitis, unspecified: Secondary | ICD-10-CM

## 2020-07-11 MED ORDER — AZITHROMYCIN 250 MG PO TABS: ORAL_TABLET | ORAL | 0 refills | Status: DC

## 2020-07-11 MED ORDER — GUAIFENESIN-CODEINE 100-10 MG/5ML PO SYRP: 5.0000 mL | ORAL_SOLUTION | Freq: Four times a day (QID) | ORAL | 0 refills | Status: DC | PRN

## 2020-07-11 NOTE — Progress Notes (Addendum)
Date:  07/11/2020   Name:  Kerri Preston   DOB:  Nov 29, 1960   MRN:  [id number]   Chief Complaint: Cough (Started Saturday, worse yesterday, sore throat, "winded", no production, pressure under eyes- took Ibuprofen and Cold and flu)  I connected withthis patient, Kerri Preston, by telephoneat the patient's home.  I verified that I am speaking with the correct person using two identifiers. This visit was conducted via telephone due to the Covid-19 outbreak from my office at [facility] in [place], [place]. I discussed the limitations, risks, security and privacy concerns of performing an evaluation and management service by telephone. I also discussed with the patient that there may be a patient responsible charge related to this service. The patient expressed understanding and agreed to proceed.  Sinusitis This is a new problem. The current episode started in the past 7 days (wed night). The problem has been gradually worsening since onset. Maximum temperature: 99.7. The fever has been present for less than 1 day. Her pain is at a severity of 2/10. The pain is moderate. Associated symptoms include chills, congestion, coughing, headaches, a hoarse voice, sinus pressure and a sore throat. Pertinent negatives include no diaphoresis, ear pain, neck pain, shortness of breath, sneezing or swollen glands. Past treatments include oral decongestants. The treatment provided mild relief.    No results found for: CREATININE, BUN, NA, K, CL, CO2 Lab Results  Component Value Date   CHOL 215 (H) 07/17/2019   HDL 63 07/17/2019   LDLCALC 126 (H) 07/17/2019   TRIG 149 07/17/2019   CHOLHDL 5.0 (H) 01/12/2018   No results found for: TSH No results found for: HGBA1C No results found for: WBC, HGB, HCT, MCV, PLT Lab Results  Component Value Date   ALT 22 12/13/2018   AST 18 12/13/2018   ALKPHOS 75 12/13/2018   BILITOT 0.8 12/13/2018     Review of Systems  Constitutional: Positive for chills and  fever. Negative for diaphoresis and fatigue.  HENT: Positive for congestion, hoarse voice, sinus pressure and sore throat. Negative for ear pain, facial swelling and sneezing.   Respiratory: Positive for cough. Negative for shortness of breath and wheezing.   Musculoskeletal: Negative for neck pain.  Neurological: Positive for headaches.    Patient Active Problem List   Diagnosis Date Noted  . Reactive airway disease, mild intermittent, with acute exacerbation 09/30/2016    No Known Allergies  Past Surgical History:  Procedure Laterality Date  . BILATERAL TEMPOROMANDIBULAR JOINT ARTHROPLASTY    . COLONOSCOPY  2012   cleared for 10 yrs  . DILATION AND CURETTAGE OF UTERUS    . TONSILLECTOMY    . WISDOM TOOTH EXTRACTION      Social History   Tobacco Use  . Smoking status: Never Smoker  . Smokeless tobacco: Never Used  Vaping Use  . Vaping Use: Never used  Substance Use Topics  . Alcohol use: No    Alcohol/week: 0.0 standard drinks  . Drug use: No     Medication list has been reviewed and updated.  Current Meds  Medication Sig  . pravastatin (PRAVACHOL) 20 MG tablet TAKE 1 TABLET BY MOUTH EVERY DAY    PHQ 2/9 Scores 07/11/2020 07/17/2019 12/13/2018 01/12/2018  PHQ - 2 Score 0 0 0 0  PHQ- 9 Score 0 0 0 0    GAD 7 : Generalized Anxiety Score 07/11/2020 07/17/2019  Nervous, Anxious, on Edge 0 0  Control/stop worrying 0 0  Worry too  much - different things 0 0  Trouble relaxing 0 0  Restless 0 0  Easily annoyed or irritable 0 0  Afraid - awful might happen 0 0  Total GAD 7 Score 0 0    BP Readings from Last 3 Encounters:  06/11/20 120/82  05/30/20 110/70  07/17/19 126/62    Physical Exam Vitals and nursing note reviewed.  Constitutional:      General: She is not in acute distress.    Appearance: She is not diaphoretic.  HENT:     Head: Normocephalic and atraumatic.     Jaw: There is normal jaw occlusion.     Right Ear: External ear normal.     Left  Ear: External ear normal.     Nose:     Right Sinus: Maxillary sinus tenderness present.     Left Sinus: Maxillary sinus tenderness present.     Mouth/Throat:     Mouth: Oropharynx is clear and moist.  Eyes:     General:        Right eye: No discharge.        Left eye: No discharge.     Extraocular Movements: EOM normal.     Conjunctiva/sclera: Conjunctivae normal.     Pupils: Pupils are equal, round, and reactive to light.  Neck:     Thyroid: No thyromegaly.     Vascular: No JVD.  Cardiovascular:     Rate and Rhythm: Normal rate and regular rhythm.     Pulses: Intact distal pulses.     Heart sounds: Normal heart sounds. No murmur heard. No friction rub. No gallop.   Pulmonary:     Effort: Pulmonary effort is normal.     Breath sounds: Normal breath sounds. No wheezing.  Abdominal:     General: Bowel sounds are normal.     Palpations: Abdomen is soft. There is no mass.     Tenderness: There is no abdominal tenderness. There is no guarding.  Musculoskeletal:        General: No edema. Normal range of motion.     Cervical back: Normal range of motion and neck supple.  Lymphadenopathy:     Cervical: No cervical adenopathy.  Skin:    General: Skin is warm and dry.  Neurological:     Mental Status: She is alert.     Deep Tendon Reflexes: Reflexes are normal and symmetric.     Wt Readings from Last 3 Encounters:  06/11/20 181 lb (82.1 kg)  05/30/20 178 lb (80.7 kg)  07/17/19 174 lb (78.9 kg)    Temp 99.7 F (37.6 C) (Oral)   Assessment and Plan: This is a televisit that was conducted because of Covid concerns and patient has had a cough.  Patient has had both vaccinations but has not had the booster as of yet.  Patient has onset which is similar to previous sinus infections.  1. Acute non-recurrent maxillary sinusitis Exam by phone and history is consistent with an acute sinusitis involving the maxillary sinuses.  We will initiate a azithromycin to 50 mg 2 today followed  by 1 a day for 4 days. - azithromycin (ZITHROMAX) 250 MG tablet; 2 today then 1 a day for 4 days  Dispense: 6 tablet; Refill: 0  2. Cough Patient's had a relatively nonproductive cough for 24 hours and has had difficulty sleeping with this and is tired we will initiate Robitussin-AC 1 teaspoon every 6 hours for this cough.  Patient is to return call or go  to urgent care if this continues. - guaiFENesin-codeine (ROBITUSSIN AC) 100-10 MG/5ML syrup; Take 5 mLs by mouth 4 (four) times daily as needed for cough.  Dispense: 118 mL; Refill: 0  Patient has been instructed with the fever and cough or shortness of breath should ensue 30 she is to obtain a Covid testing to check for the ability of isolation from individuals.                                                                                                                                                                   I spent 12 minutes with this patient, More than 50% of that time was spent in face to face education, counseling and care coordination.

## 2020-07-11 NOTE — Telephone Encounter (Signed)
Pt returned Taras call/ I advised Pt of virtual appt today at 2:20pm

## 2020-07-11 NOTE — Telephone Encounter (Signed)
Copied from [place] [id number]. Topic: Appointment Scheduling - Scheduling Inquiry for Clinic >> Jul 11, 2020  7:58 AM Lennox Solders wrote: Reason for CRM: Pt is calling and has low grade temp 99.7, cough ,sore throat and headache and would like some type of appt with dr Ronnald Ramp

## 2020-07-11 NOTE — Telephone Encounter (Signed)
See if we can put her in for a virtual/ phone visit today. If we don't have anything, she can go to urgent care

## 2020-07-28 ENCOUNTER — Telehealth: Payer: Self-pay

## 2020-07-28 NOTE — Telephone Encounter (Signed)
Spoke to pt will wait and try otc meds if not better by Wednesday she will call and set up appt. Covid test came back Negative.

## 2020-07-28 NOTE — Telephone Encounter (Signed)
Message was read to the patient that another apt would be needed for a Z Pack. Patient is states that she will wait a day or two. If she is not feeling better by Wednesday.

## 2020-07-28 NOTE — Telephone Encounter (Unsigned)
Copied from CRM [id number]. Topic: General - Other >> Jul 28, 2020  7:55 AM Lyn Hollingshead D wrote: PT requesting a second round of C pack for her sire throat / please advise

## 2020-08-18 ENCOUNTER — Encounter: Payer: Self-pay | Admitting: Emergency Medicine

## 2020-08-18 ENCOUNTER — Other Ambulatory Visit: Payer: Self-pay

## 2020-08-18 ENCOUNTER — Ambulatory Visit
Admission: EM | Admit: 2020-08-18 | Discharge: 2020-08-18 | Disposition: A | Discharge status: Home / Self Care | Payer: BC Managed Care – PPO | Attending: Internal Medicine | Admitting: Internal Medicine

## 2020-08-18 DIAGNOSIS — E785 Hyperlipidemia, unspecified: Secondary | ICD-10-CM | POA: Diagnosis not present

## 2020-08-18 DIAGNOSIS — R059 Cough, unspecified: Secondary | ICD-10-CM | POA: Diagnosis not present

## 2020-08-18 DIAGNOSIS — B349 Viral infection, unspecified: Secondary | ICD-10-CM

## 2020-08-18 DIAGNOSIS — J029 Acute pharyngitis, unspecified: Secondary | ICD-10-CM

## 2020-08-18 DIAGNOSIS — U071 COVID-19: Secondary | ICD-10-CM | POA: Insufficient documentation

## 2020-08-18 DIAGNOSIS — Z79899 Other long term (current) drug therapy: Secondary | ICD-10-CM | POA: Diagnosis not present

## 2020-08-18 LAB — RAPID INFLUENZA A&B ANTIGENS
Influenza A (ARMC): NEGATIVE
Influenza B (ARMC): NEGATIVE

## 2020-08-18 NOTE — ED Provider Notes (Signed)
[facility]    CSN: [id number] Arrival date & time: 08/18/20  1227      History   Chief Complaint Chief Complaint  Patient presents with  . Cough  . Headache    HPI Kerri Preston is a 60 y.o. female presenting for onset of body aches, headache, sore throat, cough and congestion last night.  Denies fever.  Patient states that she feels similar to when she did when she had the flu a couple years ago.  Patient has been taking ibuprofen.  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  Denies any known close exposure but states that it is highly possible.  Patient admits to COVID-19 infection and multiple family members around Christmas time but states that she did not get COVID at that time.  Currently, she denies any sinus pain, ear pain, chest pain, difficulty breathing, abdominal pain, nausea/vomiting/diarrhea, or changes in smell and taste.  Patient states that she was diagnosed with a sinus infection through her PCP about a month ago and treated with azithromycin.  Those symptoms have resolved.  Past medical history significant for hyperlipidemia.  Patient has no other concerns.  HPI  Past Medical History:  Diagnosis Date  . Hyperlipidemia     Patient Active Problem List   Diagnosis Date Noted  . Reactive airway disease, mild intermittent, with acute exacerbation 09/30/2016    Past Surgical History:  Procedure Laterality Date  . BILATERAL TEMPOROMANDIBULAR JOINT ARTHROPLASTY    . COLONOSCOPY  2012   cleared for 10 yrs  . DILATION AND CURETTAGE OF UTERUS    . TONSILLECTOMY    . WISDOM TOOTH EXTRACTION      OB History    Gravida  4   Para  2   Term  2   Preterm      AB  2   Living  2     SAB  2   IAB      Ectopic      Multiple      Live Births  2            Home Medications    Prior to Admission medications   Medication Sig Start Date End Date  Taking? Authorizing Provider  omeprazole (PRILOSEC) 20 MG capsule Take 20 mg by mouth daily.   Yes [provider]  azithromycin (ZITHROMAX) 250 MG tablet 2 today then 1 a day for 4 days 07/11/20   Juline Patch, MD  guaiFENesin-codeine [facility]) 100-10 MG/5ML syrup Take 5 mLs by mouth 4 (four) times daily as needed for cough. 07/11/20   Juline Patch, MD  pravastatin (PRAVACHOL) 20 MG tablet TAKE 1 TABLET BY MOUTH EVERY DAY 01/25/20   Juline Patch, MD    Family History Family History  Problem Relation Age of Onset  . Hypertension Father   . Breast cancer Paternal Aunt        26's  . Osteoarthritis Mother   . Osteoporosis Mother     Social History Social History   Tobacco Use  . Smoking status: Never Smoker  . Smokeless tobacco: Never Used  Vaping Use  . Vaping Use: Never used  Substance Use Topics  . Alcohol use: No    Alcohol/week: 0.0 standard drinks  . Drug use: No     Allergies   Patient has no known allergies.   Review of Systems  Review of Systems  Constitutional: Positive for fatigue. Negative for chills, diaphoresis and fever.  HENT: Positive for congestion, rhinorrhea and sore throat. Negative for ear pain, sinus pressure and sinus pain.   Respiratory: Positive for cough. Negative for shortness of breath.   Gastrointestinal: Negative for abdominal pain, nausea and vomiting.  Musculoskeletal: Positive for myalgias. Negative for arthralgias.  Skin: Negative for rash.  Neurological: Positive for headaches. Negative for weakness.  Hematological: Negative for adenopathy.     Physical Exam Triage Vital Signs ED Triage Vitals  Enc Vitals Group     BP 08/18/20 1238 132/89     Pulse Rate 08/18/20 1238 91     Resp 08/18/20 1238 18     Temp 08/18/20 1238 98.5 F (36.9 C)     Temp Source 08/18/20 1238 Oral     SpO2 08/18/20 1238 99 %     Weight 08/18/20 1237 181 lb (82.1 kg)     Height 08/18/20 1237 5\' 6"  (1.676 m)     Head Circumference  --      Peak Flow --      Pain Score 08/18/20 1236 8     Pain Loc --      Pain Edu? --      Excl. in [place]? --    No data found.  Updated Vital Signs BP 132/89 (BP Location: Left Arm)   Pulse 91   Temp 98.5 F (36.9 C) (Oral)   Resp 18   Ht 5\' 6"  (1.676 m)   Wt 181 lb (82.1 kg)   SpO2 99%   BMI 29.21 kg/m       Physical Exam Vitals and nursing note reviewed.  Constitutional:      General: She is not in acute distress.    Appearance: Normal appearance. She is not ill-appearing or toxic-appearing.  HENT:     Head: Normocephalic and atraumatic.     Right Ear: Tympanic membrane, ear canal and external ear normal.     Left Ear: Tympanic membrane, ear canal and external ear normal.     Nose: Congestion and rhinorrhea (trace clear drainage) present.     Mouth/Throat:     Mouth: Mucous membranes are moist.     Pharynx: Oropharynx is clear. Posterior oropharyngeal erythema (mild with clear PND) present.  Eyes:     General: No scleral icterus.       Right eye: No discharge.        Left eye: No discharge.     Conjunctiva/sclera: Conjunctivae normal.  Cardiovascular:     Rate and Rhythm: Normal rate and regular rhythm.     Heart sounds: Normal heart sounds.  Pulmonary:     Effort: Pulmonary effort is normal. No respiratory distress.     Breath sounds: Normal breath sounds. No wheezing, rhonchi or rales.  Musculoskeletal:     Cervical back: Neck supple.  Skin:    General: Skin is dry.  Neurological:     General: No focal deficit present.     Mental Status: She is alert. Mental status is at baseline.     Motor: No weakness.     Gait: Gait normal.  Psychiatric:        Mood and Affect: Mood normal.        Behavior: Behavior normal.        Thought Content: Thought content normal.      UC Treatments / Results  Labs (all labs ordered are listed, but only abnormal results are displayed)  Labs Reviewed  SARS CORONAVIRUS 2 (TAT 6-24 HRS)  RAPID INFLUENZA A&B ANTIGENS     EKG   Radiology No results found.  Procedures Procedures (including critical care time)  Medications Ordered in UC Medications - No data to display  Initial Impression / Assessment and Plan / UC Course  I have reviewed the triage vital signs and the nursing notes.  Pertinent labs & imaging results that were available during my care of the patient were reviewed by me and considered in my medical decision making (see chart for details).   All vital signs are normal and stable.  She is in no acute distress.  On exam, she has mild nasal congestion with clear rhinorrhea and mild posterior pharyngeal erythema with clear postnasal drainage.  Her chest is clear to auscultation heart regular rate and rhythm.  Patient does state that she feels similar to when she had influenza.  Rapid influenza testing obtained today. Negative results. Discussed with patient.  Suspect viral illness. Send out COVID-19 testing performed.  Current CDC guidelines, isolation protocol, and ED precautions reviewed with patient.  Advised supportive care with increasing rest and fluids.  Advised over-the-counter cough syrups and nasal sprays as well as Tylenol and Motrin as needed for any aches and pains. She declined any prescription cough medications today. Advised to follow-up with our clinic or PCP for any new or worsening symptoms or if she is not better within 7 to 10 days.   Final Clinical Impressions(s) / UC Diagnoses   Final diagnoses:  Viral illness  Cough  Sore throat     Discharge Instructions     You have received COVID testing today either for positive exposure, concerning symptoms that could be related to COVID infection, screening purposes, or re-testing after confirmed positive.  Your test obtained today checks for active viral infection in the last 1-2 weeks. If your test is negative now, you can still test positive later. So, if you do develop symptoms you should either get re-tested and/or  isolate x 5 days and then strict mask use x 5 days (unvaccinated) or mask use x 10 days (vaccinated). Please follow CDC guidelines.  While Rapid antigen tests come back in 15-20 minutes, send out PCR/molecular test results typically come back within 1-3 days. In the mean time, if you are symptomatic, assume this could be a positive test and treat/monitor yourself as if you do have COVID.   We will call with test results if positive. Please download the MyChart app and set up a profile to access test results.   If symptomatic, go home and rest. Push fluids. Take Tylenol as needed for discomfort. Gargle warm salt water. Throat lozenges. Take Mucinex DM or Robitussin for cough. Humidifier in bedroom to ease coughing. Warm showers. Also review the COVID handout for more information.  COVID-19 INFECTION: The incubation period of COVID-19 is approximately 14 days after exposure, with most symptoms developing in roughly 4-5 days. Symptoms may range in severity from mild to critically severe. Roughly 80% of those infected will have mild symptoms. People of any age may become infected with COVID-19 and have the ability to transmit the virus. The most common symptoms include: fever, fatigue, cough, body aches, headaches, sore throat, nasal congestion, shortness of breath, nausea, vomiting, diarrhea, changes in smell and/or taste.    COURSE OF ILLNESS Some patients may begin with mild disease which can progress quickly into critical symptoms. If your symptoms are worsening please call ahead to the Emergency Department  and proceed there for further treatment. Recovery time appears to be roughly 1-2 weeks for mild symptoms and 3-6 weeks for severe disease.   GO IMMEDIATELY TO ER FOR FEVER YOU ARE UNABLE TO GET DOWN WITH TYLENOL, BREATHING PROBLEMS, CHEST PAIN, FATIGUE, LETHARGY, INABILITY TO EAT OR DRINK, ETC  QUARANTINE AND ISOLATION: To help decrease the spread of COVID-19 please remain isolated if you have COVID  infection or are highly suspected to have COVID infection. This means -stay home and isolate to one room in the home if you live with others. Do not share a bed or bathroom with others while ill, sanitize and wipe down all countertops and keep common areas clean and disinfected. Stay home for 5 days. If you have no symptoms or your symptoms are resolving after 5 days, you can leave your house. Continue to wear a mask around others for 5 additional days. If you have been in close contact (within 6 feet) of someone diagnosed with COVID 19, you are advised to quarantine in your home for 14 days as symptoms can develop anywhere from 2-14 days after exposure to the virus. If you develop symptoms, you  must isolate.  Most current guidelines for COVID after exposure -unvaccinated: isolate 5 days and strict mask use x 5 days. Test on day 5 is possible -vaccinated: wear mask x 10 days if symptoms do not develop -You do not necessarily need to be tested for COVID if you have + exposure and  develop symptoms. Just isolate at home x10 days from symptom onset During this global pandemic, CDC advises to practice social distancing, try to stay at least 69ft away from others at all times. Wear a face covering. Wash and sanitize your hands regularly and avoid going anywhere that is not necessary.  KEEP IN MIND THAT THE COVID TEST IS NOT 100% ACCURATE AND YOU SHOULD STILL DO EVERYTHING TO PREVENT POTENTIAL SPREAD OF VIRUS TO OTHERS (WEAR MASK, WEAR GLOVES, [place] HANDS AND SANITIZE REGULARLY). IF INITIAL TEST IS NEGATIVE, THIS MAY NOT MEAN YOU ARE DEFINITELY NEGATIVE. MOST ACCURATE TESTING IS DONE 5-7 DAYS AFTER EXPOSURE.   It is not advised by CDC to get re-tested after receiving a positive COVID test since you can still test positive for weeks to months after you have already cleared the virus.   *If you have not been vaccinated for COVID, I strongly suggest you consider getting vaccinated as long as there are no  contraindications.      ED Prescriptions    None     PDMP not reviewed this encounter.   Danton Clap, PA-C 08/18/20 1407

## 2020-08-18 NOTE — Discharge Instructions (Addendum)
Negative flu test  You have received COVID testing today either for positive exposure, concerning symptoms that could be related to COVID infection, screening purposes, or re-testing after confirmed positive.  Your test obtained today checks for active viral infection in the last 1-2 weeks. If your test is negative now, you can still test positive later. So, if you do develop symptoms you should either get re-tested and/or isolate x 5 days and then strict mask use x 5 days (unvaccinated) or mask use x 10 days (vaccinated). Please follow CDC guidelines.  While Rapid antigen tests come back in 15-20 minutes, send out PCR/molecular test results typically come back within 1-3 days. In the mean time, if you are symptomatic, assume this could be a positive test and treat/monitor yourself as if you do have COVID.   We will call with test results if positive. Please download the MyChart app and set up a profile to access test results.   If symptomatic, go home and rest. Push fluids. Take Tylenol as needed for discomfort. Gargle warm salt water. Throat lozenges. Take Mucinex DM or Robitussin for cough. Humidifier in bedroom to ease coughing. Warm showers. Also review the COVID handout for more information.  COVID-19 INFECTION: The incubation period of COVID-19 is approximately 14 days after exposure, with most symptoms developing in roughly 4-5 days. Symptoms may range in severity from mild to critically severe. Roughly 80% of those infected will have mild symptoms. People of any age may become infected with COVID-19 and have the ability to transmit the virus. The most common symptoms include: fever, fatigue, cough, body aches, headaches, sore throat, nasal congestion, shortness of breath, nausea, vomiting, diarrhea, changes in smell and/or taste.    COURSE OF ILLNESS Some patients may begin with mild disease which can progress quickly into critical symptoms. If your symptoms are worsening please call ahead to  the Emergency Department and proceed there for further treatment. Recovery time appears to be roughly 1-2 weeks for mild symptoms and 3-6 weeks for severe disease.   GO IMMEDIATELY TO ER FOR FEVER YOU ARE UNABLE TO GET DOWN WITH TYLENOL, BREATHING PROBLEMS, CHEST PAIN, FATIGUE, LETHARGY, INABILITY TO EAT OR DRINK, ETC  QUARANTINE AND ISOLATION: To help decrease the spread of COVID-19 please remain isolated if you have COVID infection or are highly suspected to have COVID infection. This means -stay home and isolate to one room in the home if you live with others. Do not share a bed or bathroom with others while ill, sanitize and wipe down all countertops and keep common areas clean and disinfected. Stay home for 5 days. If you have no symptoms or your symptoms are resolving after 5 days, you can leave your house. Continue to wear a mask around others for 5 additional days. If you have been in close contact (within 6 feet) of someone diagnosed with COVID 19, you are advised to quarantine in your home for 14 days as symptoms can develop anywhere from 2-14 days after exposure to the virus. If you develop symptoms, you  must isolate.  Most current guidelines for COVID after exposure -unvaccinated: isolate 5 days and strict mask use x 5 days. Test on day 5 is possible -vaccinated: wear mask x 10 days if symptoms do not develop -You do not necessarily need to be tested for COVID if you have + exposure and  develop symptoms. Just isolate at home x10 days from symptom onset During this global pandemic, CDC advises to practice social distancing, try   to stay at least 6ft away from others at all times. Wear a face covering. Wash and sanitize your hands regularly and avoid going anywhere that is not necessary.  KEEP IN MIND THAT THE COVID TEST IS NOT 100% ACCURATE AND YOU SHOULD STILL DO EVERYTHING TO PREVENT POTENTIAL SPREAD OF VIRUS TO OTHERS (WEAR MASK, WEAR GLOVES, WASH HANDS AND SANITIZE REGULARLY). IF INITIAL  TEST IS NEGATIVE, THIS MAY NOT MEAN YOU ARE DEFINITELY NEGATIVE. MOST ACCURATE TESTING IS DONE 5-7 DAYS AFTER EXPOSURE.   It is not advised by CDC to get re-tested after receiving a positive COVID test since you can still test positive for weeks to months after you have already cleared the virus.   *If you have not been vaccinated for COVID, I strongly suggest you consider getting vaccinated as long as there are no contraindications.   

## 2020-08-18 NOTE — ED Triage Notes (Signed)
Pt c/o cough, chest congestion, sore throat, headache, body aches. Started last night.

## 2020-08-19 ENCOUNTER — Telehealth: Payer: Self-pay

## 2020-08-19 LAB — SARS CORONAVIRUS 2 (TAT 6-24 HRS): SARS Coronavirus 2: POSITIVE — AB

## 2020-08-19 NOTE — Telephone Encounter (Signed)
Pt went to Urgent Care. 

## 2020-08-19 NOTE — Telephone Encounter (Signed)
Hey call pt and tell her that she can come and have a virtual in parking lot. We will need to swab her. It will take several days for test to come back. Therefore, no antibiotics until we see results

## 2020-08-19 NOTE — Telephone Encounter (Unsigned)
Copied from [place] [id number]. Topic: Appointment Scheduling - Scheduling Inquiry for Clinic >> Aug 18, 2020  8:12 AM Kerri Preston wrote: Reason for CRM: pt called to schedule an appt/ Pt stated she feels like she has the flu/ headache, mucle pain, sore throat, fever chills / please advise

## 2020-09-23 ENCOUNTER — Other Ambulatory Visit: Payer: Self-pay | Admitting: Family Medicine

## 2020-09-23 DIAGNOSIS — E7800 Pure hypercholesterolemia, unspecified: Secondary | ICD-10-CM

## 2020-09-24 ENCOUNTER — Ambulatory Visit (INDEPENDENT_AMBULATORY_CARE_PROVIDER_SITE_OTHER): Payer: BC Managed Care – PPO | Admitting: Family Medicine

## 2020-09-24 ENCOUNTER — Other Ambulatory Visit: Payer: Self-pay

## 2020-09-24 ENCOUNTER — Encounter: Payer: Self-pay | Admitting: Family Medicine

## 2020-09-24 VITALS — BP 130/80 | HR 76 | Ht 66.0 in | Wt 181.0 lb

## 2020-09-24 DIAGNOSIS — E7800 Pure hypercholesterolemia, unspecified: Secondary | ICD-10-CM

## 2020-09-24 DIAGNOSIS — H6983 Other specified disorders of Eustachian tube, bilateral: Secondary | ICD-10-CM

## 2020-09-24 MED ORDER — FLUTICASONE PROPIONATE 50 MCG/ACT NA SUSP: 2.0000 | Freq: Every day | NASAL | 6 refills | Status: DC

## 2020-09-24 MED ORDER — PRAVASTATIN SODIUM 20 MG PO TABS: ORAL_TABLET | ORAL | 1 refills | Status: DC

## 2020-09-24 NOTE — Patient Instructions (Signed)

## 2020-09-24 NOTE — Progress Notes (Signed)
Date:  09/24/2020   Name:  Kerri Preston   DOB:  1961/01/08   MRN:  [id number]   Chief Complaint: Ear Pain (Having "off- balance" episodes with a scratchy throat. Ears are feeling better today- taking Ibuprofen) and Hyperlipidemia  Hyperlipidemia This is a chronic problem. The current episode started more than 1 year ago. The problem is controlled. Recent lipid tests were reviewed and are normal. Pertinent negatives include no chest pain, focal sensory loss, leg pain, myalgias or shortness of breath. Current antihyperlipidemic treatment includes statins. The current treatment provides moderate improvement of lipids. There are no compliance problems.   Otalgia  There is pain in both ears. This is a chronic problem. The current episode started more than 1 year ago. The problem occurs constantly. The problem has been waxing and waning. There has been no fever. The pain is mild. Associated symptoms include rhinorrhea and a sore throat. Pertinent negatives include no abdominal pain, coughing, diarrhea, ear discharge, headaches, hearing loss, rash or vomiting. Associated symptoms comments: vertigo. She has tried acetaminophen for the symptoms. The treatment provided moderate relief.    No results found for: CREATININE, BUN, NA, K, CL, CO2 Lab Results  Component Value Date   CHOL 215 (H) 07/17/2019   HDL 63 07/17/2019   LDLCALC 126 (H) 07/17/2019   TRIG 149 07/17/2019   CHOLHDL 5.0 (H) 01/12/2018   No results found for: TSH No results found for: HGBA1C No results found for: WBC, HGB, HCT, MCV, PLT Lab Results  Component Value Date   ALT 22 12/13/2018   AST 18 12/13/2018   ALKPHOS 75 12/13/2018   BILITOT 0.8 12/13/2018     Review of Systems  Constitutional: Negative.  Negative for chills, fatigue, fever and unexpected weight change.  HENT: Positive for ear pain, rhinorrhea and sore throat. Negative for congestion, ear discharge, hearing loss, sinus pressure and sneezing.   Eyes:  Negative for double vision, photophobia, pain, discharge, redness and itching.  Respiratory: Negative for cough, shortness of breath, wheezing and stridor.   Cardiovascular: Negative for chest pain.  Gastrointestinal: Negative for abdominal pain, blood in stool, constipation, diarrhea, nausea and vomiting.  Endocrine: Negative for cold intolerance, heat intolerance, polydipsia, polyphagia and polyuria.  Genitourinary: Negative for dysuria, flank pain, frequency, hematuria, menstrual problem, pelvic pain, urgency, vaginal bleeding and vaginal discharge.  Musculoskeletal: Negative for arthralgias, back pain and myalgias.  Skin: Negative for rash.  Allergic/Immunologic: Negative for environmental allergies and food allergies.  Neurological: Negative for dizziness, weakness, light-headedness, numbness and headaches.  Hematological: Negative for adenopathy. Does not bruise/bleed easily.  Psychiatric/Behavioral: Negative for dysphoric mood. The patient is not nervous/anxious.     Patient Active Problem List   Diagnosis Date Noted  . Reactive airway disease, mild intermittent, with acute exacerbation 09/30/2016    No Known Allergies  Past Surgical History:  Procedure Laterality Date  . BILATERAL TEMPOROMANDIBULAR JOINT ARTHROPLASTY    . COLONOSCOPY  2012   cleared for 10 yrs  . DILATION AND CURETTAGE OF UTERUS    . TONSILLECTOMY    . WISDOM TOOTH EXTRACTION      Social History   Tobacco Use  . Smoking status: Never Smoker  . Smokeless tobacco: Never Used  Vaping Use  . Vaping Use: Never used  Substance Use Topics  . Alcohol use: No    Alcohol/week: 0.0 standard drinks  . Drug use: No     Medication list has been reviewed and updated.  Current Meds  Medication Sig  . pravastatin (PRAVACHOL) 20 MG tablet TAKE 1 TABLET(20 MG) BY MOUTH DAILY  . [DISCONTINUED] omeprazole (PRILOSEC) 20 MG capsule Take 20 mg by mouth daily.    PHQ 2/9 Scores 07/11/2020 07/17/2019 12/13/2018  01/12/2018  PHQ - 2 Score 0 0 0 0  PHQ- 9 Score 0 0 0 0    GAD 7 : Generalized Anxiety Score 07/11/2020 07/17/2019  Nervous, Anxious, on Edge 0 0  Control/stop worrying 0 0  Worry too much - different things 0 0  Trouble relaxing 0 0  Restless 0 0  Easily annoyed or irritable 0 0  Afraid - awful might happen 0 0  Total GAD 7 Score 0 0    BP Readings from Last 3 Encounters:  09/24/20 130/80  08/18/20 132/89  06/11/20 120/82    Physical Exam Vitals and nursing note reviewed.  Constitutional:      General: She is not in acute distress.    Appearance: She is not diaphoretic.  HENT:     Head: Normocephalic and atraumatic.     Right Ear: Ear canal and external ear normal. Tympanic membrane is retracted.     Left Ear: Ear canal and external ear normal. Tympanic membrane is retracted.     Nose: Nose normal. No congestion or rhinorrhea.     Right Turbinates: Swollen.     Left Turbinates: Swollen.     Mouth/Throat:     Mouth: Oropharynx is clear and moist. Mucous membranes are dry.     Pharynx: Oropharynx is clear. No pharyngeal swelling or oropharyngeal exudate.  Eyes:     General:        Right eye: No discharge.        Left eye: No discharge.     Extraocular Movements: EOM normal.     Conjunctiva/sclera: Conjunctivae normal.     Pupils: Pupils are equal, round, and reactive to light.  Neck:     Thyroid: No thyromegaly.     Vascular: No JVD.  Cardiovascular:     Rate and Rhythm: Normal rate and regular rhythm.     Pulses: Intact distal pulses.     Heart sounds: Normal heart sounds. No murmur heard. No friction rub. No gallop.   Pulmonary:     Effort: Pulmonary effort is normal.     Breath sounds: Normal breath sounds. No wheezing or rhonchi.  Abdominal:     General: Bowel sounds are normal.     Palpations: Abdomen is soft. There is no mass.     Tenderness: There is no abdominal tenderness. There is no guarding.  Musculoskeletal:        General: No edema. Normal range  of motion.     Cervical back: Normal range of motion and neck supple.  Lymphadenopathy:     Cervical: No cervical adenopathy.  Skin:    General: Skin is warm and dry.     Capillary Refill: Capillary refill takes less than 2 seconds.     Findings: No bruising or erythema.  Neurological:     General: No focal deficit present.     Mental Status: She is alert.     Deep Tendon Reflexes: Reflexes are normal and symmetric.     Wt Readings from Last 3 Encounters:  09/24/20 181 lb (82.1 kg)  08/18/20 181 lb (82.1 kg)  06/11/20 181 lb (82.1 kg)    BP 130/80   Pulse 76   Ht 5\' 6"  (1.676 m)   Wt 181 lb (82.1  kg)   BMI 29.21 kg/m   Assessment and Plan: 1. Pure hypercholesterolemia  Chronic.  Controlled.  Stable.  Will check lipid panel and CMP to evaluate lipid status as well as evaluate hepatic status.  Patient will continue pravastatin 20 mg once a day. - pravastatin (PRAVACHOL) 20 MG tablet; TAKE 1 TABLET(20 MG) BY MOUTH DAILY  Dispense: 90 tablet; Refill: 1 - Lipid Panel With LDL/HDL Ratio - Comprehensive Metabolic Panel (CMET)  2. Eustachian tube dysfunction, bilateral New onset.  Persistent.  Waxes and wanes and presentation.  This is likely under allergies at this point and we will prescribe fluticasone nasal spray 2 sprays in both nostrils daily as well as suggested Sudafed and the lowest dosing possible to be taken on a daily basis to equilibrate ears bilateral. - fluticasone (FLONASE) 50 MCG/ACT nasal spray; Place 2 sprays into both nostrils daily.  Dispense: 16 g; Refill: 6

## 2020-09-25 ENCOUNTER — Ambulatory Visit: Payer: BC Managed Care – PPO | Admitting: Family Medicine

## 2020-09-25 LAB — COMPREHENSIVE METABOLIC PANEL
ALT: 21 IU/L (ref 0–32)
AST: 22 IU/L (ref 0–40)
Albumin/Globulin Ratio: 1.8 (ref 1.2–2.2)
Albumin: 4.5 g/dL (ref 3.8–4.8)
Alkaline Phosphatase: 75 IU/L (ref 44–121)
BUN/Creatinine Ratio: 14 (ref 12–28)
BUN: 13 mg/dL (ref 8–27)
Bilirubin Total: 0.7 mg/dL (ref 0.0–1.2)
CO2: 22 mmol/L (ref 20–29)
Calcium: 9.6 mg/dL (ref 8.7–10.3)
Chloride: 103 mmol/L (ref 96–106)
Creatinine, Ser: 0.95 mg/dL (ref 0.57–1.00)
GFR calc Af Amer: 75 mL/min/{1.73_m2} (ref >59)
GFR calc non Af Amer: 65 mL/min/{1.73_m2} (ref >59)
Globulin, Total: 2.5 g/dL (ref 1.5–4.5)
Glucose: 84 mg/dL (ref 65–99)
Potassium: 4.6 mmol/L (ref 3.5–5.2)
Sodium: 140 mmol/L (ref 134–144)
Total Protein: 7 g/dL (ref 6.0–8.5)

## 2020-09-25 LAB — LIPID PANEL WITH LDL/HDL RATIO
Cholesterol, Total: 209 mg/dL — ABNORMAL HIGH (ref 100–199)
HDL: 59 mg/dL (ref >39)
LDL Chol Calc (NIH): 128 mg/dL — ABNORMAL HIGH (ref 0–99)
LDL/HDL Ratio: 2.2 ratio (ref 0.0–3.2)
Triglycerides: 124 mg/dL (ref 0–149)
VLDL Cholesterol Cal: 22 mg/dL (ref 5–40)

## 2020-10-21 ENCOUNTER — Other Ambulatory Visit: Payer: Self-pay | Admitting: Family Medicine

## 2020-10-21 DIAGNOSIS — E7800 Pure hypercholesterolemia, unspecified: Secondary | ICD-10-CM

## 2020-10-28 DIAGNOSIS — D225 Melanocytic nevi of trunk: Secondary | ICD-10-CM | POA: Diagnosis not present

## 2020-10-28 DIAGNOSIS — L570 Actinic keratosis: Secondary | ICD-10-CM | POA: Diagnosis not present

## 2020-10-28 DIAGNOSIS — D485 Neoplasm of uncertain behavior of skin: Secondary | ICD-10-CM | POA: Diagnosis not present

## 2020-10-28 DIAGNOSIS — X32XXXA Exposure to sunlight, initial encounter: Secondary | ICD-10-CM | POA: Diagnosis not present

## 2020-10-28 DIAGNOSIS — Z85828 Personal history of other malignant neoplasm of skin: Secondary | ICD-10-CM | POA: Diagnosis not present

## 2020-10-28 DIAGNOSIS — L814 Other melanin hyperpigmentation: Secondary | ICD-10-CM | POA: Diagnosis not present

## 2021-03-10 ENCOUNTER — Ambulatory Visit: Payer: BC Managed Care – PPO | Admitting: Family Medicine

## 2021-03-24 ENCOUNTER — Other Ambulatory Visit: Payer: Self-pay

## 2021-03-24 ENCOUNTER — Ambulatory Visit (INDEPENDENT_AMBULATORY_CARE_PROVIDER_SITE_OTHER): Payer: BC Managed Care – PPO | Admitting: Family Medicine

## 2021-03-24 ENCOUNTER — Encounter: Payer: Self-pay | Admitting: Family Medicine

## 2021-03-24 VITALS — BP 120/80 | HR 76 | Ht 66.0 in | Wt 181.0 lb

## 2021-03-24 DIAGNOSIS — R079 Chest pain, unspecified: Secondary | ICD-10-CM | POA: Diagnosis not present

## 2021-03-24 DIAGNOSIS — E7800 Pure hypercholesterolemia, unspecified: Secondary | ICD-10-CM | POA: Diagnosis not present

## 2021-03-24 MED ORDER — PRAVASTATIN SODIUM 20 MG PO TABS
ORAL_TABLET | ORAL | 1 refills | Status: DC
Start: 2021-03-24 — End: 2021-06-07

## 2021-03-24 NOTE — Progress Notes (Signed)
Date:  03/24/2021   Name:  Kerri Preston   DOB:  [birth year]   MRN:  [id number]   Chief Complaint: Hyperlipidemia  Hyperlipidemia This is a chronic problem. The current episode started more than 1 year ago. The problem is controlled. Recent lipid tests were reviewed and are normal. She has no history of chronic renal disease, diabetes, hypothyroidism, liver disease, obesity or nephrotic syndrome. Associated symptoms include chest pain. Pertinent negatives include no focal sensory loss, focal weakness, leg pain, myalgias or shortness of breath (chest pain). Current antihyperlipidemic treatment includes statins. The current treatment provides moderate improvement of lipids.  Chest Pain  This is a new problem. The current episode started in the past 7 days (awoke from sleep/ Thursday). The onset quality is sudden. Episode frequency: 2 episodes. Progression since onset: duration 3-4 min. The pain is present in the substernal region. Quality: sharp. The pain radiates to the upper back. Associated symptoms include back pain and headaches. Pertinent negatives include no abdominal pain, cough, dizziness, fever, leg pain, nausea, numbness, palpitations, shortness of breath (chest pain) or weakness.  Her past medical history is significant for hyperlipidemia.  Pertinent negatives for past medical history include no diabetes.   Lab Results  Component Value Date   CREATININE 0.95 09/24/2020   BUN 13 09/24/2020   NA 140 09/24/2020   K 4.6 09/24/2020   CL 103 09/24/2020   CO2 22 09/24/2020   Lab Results  Component Value Date   CHOL 209 (H) 09/24/2020   HDL 59 09/24/2020   LDLCALC 128 (H) 09/24/2020   TRIG 124 09/24/2020   CHOLHDL 5.0 (H) 01/12/2018   No results found for: TSH No results found for: HGBA1C No results found for: WBC, HGB, HCT, MCV, PLT Lab Results  Component Value Date   ALT 21 09/24/2020   AST 22 09/24/2020   ALKPHOS 75 09/24/2020   BILITOT 0.7 09/24/2020     Review of  Systems  Constitutional:  Negative for chills and fever.  HENT:  Negative for drooling, ear discharge, ear pain and sore throat.   Eyes:  Negative for visual disturbance.  Respiratory:  Negative for cough, shortness of breath (chest pain) and wheezing.   Cardiovascular:  Positive for chest pain. Negative for palpitations and leg swelling.  Gastrointestinal:  Negative for abdominal pain, blood in stool, constipation, diarrhea and nausea.  Endocrine: Negative for polydipsia.  Genitourinary:  Negative for dysuria, frequency, hematuria and urgency.  Musculoskeletal:  Positive for back pain. Negative for myalgias and neck pain.  Skin:  Negative for rash.  Allergic/Immunologic: Negative for environmental allergies.  Neurological:  Positive for headaches. Negative for dizziness, focal weakness, weakness and numbness.  Hematological:  Does not bruise/bleed easily.  Psychiatric/Behavioral:  Negative for suicidal ideas. The patient is not nervous/anxious.    Patient Active Problem List   Diagnosis Date Noted   Reactive airway disease, mild intermittent, with acute exacerbation 09/30/2016    No Known Allergies  Past Surgical History:  Procedure Laterality Date   BILATERAL TEMPOROMANDIBULAR JOINT ARTHROPLASTY     COLONOSCOPY  2012   cleared for 10 yrs   DILATION AND CURETTAGE OF UTERUS     TONSILLECTOMY     WISDOM TOOTH EXTRACTION      Social History   Tobacco Use   Smoking status: Never   Smokeless tobacco: Never  Vaping Use   Vaping Use: Never used  Substance Use Topics   Alcohol use: No    Alcohol/week: 0.0 standard  drinks   Drug use: No     Medication list has been reviewed and updated.  Current Meds  Medication Sig   pravastatin (PRAVACHOL) 20 MG tablet TAKE 1 TABLET(20 MG) BY MOUTH DAILY    PHQ 2/9 Scores 03/24/2021 07/11/2020 07/17/2019 12/13/2018  PHQ - 2 Score 0 0 0 0  PHQ- 9 Score 3 0 0 0    GAD 7 : Generalized Anxiety Score 03/24/2021 07/11/2020 07/17/2019   Nervous, Anxious, on Edge 0 0 0  Control/stop worrying 0 0 0  Worry too much - different things 2 0 0  Trouble relaxing 0 0 0  Restless 0 0 0  Easily annoyed or irritable 0 0 0  Afraid - awful might happen 0 0 0  Total GAD 7 Score 2 0 0  Anxiety Difficulty Not difficult at all - -    BP Readings from Last 3 Encounters:  09/24/20 130/80  08/18/20 132/89  06/11/20 120/82    Physical Exam Vitals and nursing note reviewed.  Constitutional:      General: She is not in acute distress.    Appearance: She is not diaphoretic.  HENT:     Head: Normocephalic and atraumatic.     Right Ear: External ear normal.     Left Ear: External ear normal.     Nose: Nose normal.  Eyes:     General:        Right eye: No discharge.        Left eye: No discharge.     Conjunctiva/sclera: Conjunctivae normal.     Pupils: Pupils are equal, round, and reactive to light.  Neck:     Thyroid: No thyromegaly.     Vascular: No JVD.  Cardiovascular:     Rate and Rhythm: Normal rate and regular rhythm.     Chest Wall: PMI is not displaced.     Heart sounds: Normal heart sounds, S1 normal and S2 normal. No murmur heard. No systolic murmur is present.  No diastolic murmur is present.    No friction rub. No gallop. No S3 or S4 sounds.  Pulmonary:     Effort: Pulmonary effort is normal.     Breath sounds: Normal breath sounds.  Chest:     Chest wall: No tenderness.  Abdominal:     General: Bowel sounds are normal.     Palpations: Abdomen is soft. There is no mass.     Tenderness: There is no abdominal tenderness. There is no guarding.  Musculoskeletal:        General: Normal range of motion.     Cervical back: Normal range of motion and neck supple.     Right lower leg: No edema.     Left lower leg: No edema.  Lymphadenopathy:     Cervical: No cervical adenopathy.  Skin:    General: Skin is warm and dry.  Neurological:     Mental Status: She is alert.     Deep Tendon Reflexes: Reflexes are  normal and symmetric.    Wt Readings from Last 3 Encounters:  03/24/21 181 lb (82.1 kg)  09/24/20 181 lb (82.1 kg)  08/18/20 181 lb (82.1 kg)    Ht _0  (1.676 m)   Wt 181 lb (82.1 kg)   BMI 29.21 kg/m   Assessment and Plan:  1. Chest pain, unspecified type New onset.  Atypical chest pain.  Patient had 2 episodes that she was awoken at night with chest pain across the chest radiating  to the upper back.  No other associated symptoms.  Lasting 3 to 5 minutes.  EKG as follows: Normal sinus rhythm rate 62 intervals normal axis normal no voltage criteria for LVH met.  No ischemic changes noted such as Q waves, ST-T wave changes.  There is possible delay in R wave progression in the precordial leads.  Proceed with cardiology consult. - EKG 12-Lead - Ambulatory referral to Cardiology  2. Pure hypercholesterolemia Chronic.  Controlled.  Stable.  Continue pravastatin 20 mg once a day.  Will check lipid panel. - pravastatin (PRAVACHOL) 20 MG tablet; Take 1 tablet daily  Dispense: 90 tablet; Refill: 1 - Lipid Panel With LDL/HDL Ratio

## 2021-03-25 LAB — LIPID PANEL WITH LDL/HDL RATIO
Cholesterol, Total: 207 mg/dL — ABNORMAL HIGH (ref 100–199)
HDL: 48 mg/dL (ref >39)
LDL Chol Calc (NIH): 125 mg/dL — ABNORMAL HIGH (ref 0–99)
LDL/HDL Ratio: 2.6 ratio (ref 0.0–3.2)
Triglycerides: 190 mg/dL — ABNORMAL HIGH (ref 0–149)
VLDL Cholesterol Cal: 34 mg/dL (ref 5–40)

## 2021-04-02 DIAGNOSIS — E782 Mixed hyperlipidemia: Secondary | ICD-10-CM | POA: Insufficient documentation

## 2021-04-02 DIAGNOSIS — I208 Other forms of angina pectoris: Secondary | ICD-10-CM | POA: Insufficient documentation

## 2021-04-03 ENCOUNTER — Other Ambulatory Visit: Payer: Self-pay | Admitting: Internal Medicine

## 2021-04-03 DIAGNOSIS — I208 Other forms of angina pectoris: Secondary | ICD-10-CM

## 2021-04-08 ENCOUNTER — Other Ambulatory Visit: Payer: Self-pay

## 2021-04-08 ENCOUNTER — Ambulatory Visit
Admission: RE | Admit: 2021-04-08 | Discharge: 2021-04-08 | Disposition: A | Discharge status: Home / Self Care | Payer: Self-pay | Source: Ambulatory Visit | Attending: Internal Medicine | Admitting: Internal Medicine

## 2021-04-08 DIAGNOSIS — I208 Other forms of angina pectoris: Secondary | ICD-10-CM | POA: Insufficient documentation

## 2021-04-20 ENCOUNTER — Ambulatory Visit: Payer: Self-pay | Admitting: *Deleted

## 2021-04-20 ENCOUNTER — Ambulatory Visit: Payer: BC Managed Care – PPO | Admitting: Family Medicine

## 2021-04-20 NOTE — Telephone Encounter (Signed)
Husband calling to make pt an appt for today.  Pt is driving her son to a clinic and will not be back until around 3 pm.  He did say she can take a call if someone needs to speak with her.  Pt saw Dr Ronnald Ramp on 8/24 for acid reflux.  She is not better and wanted to see Dr Ronnald Ramp today.  She has back pain now, and he said "chest pain" but he said it is associated with her acid reflux, indigestion.    I see now she has reschedule her appt until tomorrow.   Call to patient: Patient states she was previously seen for back/chest pain. She did have follow up at cardiologist- everything is fine- but patient is still having symptoms. Patient states she was told- she needs to check gallbladder/reflux next. Appointment with provider has been scheduled for tomorrow. Patient is out of town today. Call sent is informational to office. Reason for Disposition  Requesting regular office appointment  Answer Assessment - Initial Assessment Questions 1. REASON FOR CALL or QUESTION: "What is your reason for calling today?" or "How can I best help you?" or "What question do you have that I can help answer?"     Schedule appointment- follow up for continuing symptoms- back/chest pain  Protocols used: Information Only Call - No Triage-A-AH

## 2021-04-21 ENCOUNTER — Other Ambulatory Visit: Payer: Self-pay

## 2021-04-21 ENCOUNTER — Encounter: Payer: Self-pay | Admitting: Family Medicine

## 2021-04-21 ENCOUNTER — Ambulatory Visit (INDEPENDENT_AMBULATORY_CARE_PROVIDER_SITE_OTHER): Payer: BC Managed Care – PPO | Admitting: Family Medicine

## 2021-04-21 VITALS — BP 102/62 | HR 80 | Ht 66.0 in | Wt 178.0 lb

## 2021-04-21 DIAGNOSIS — K219 Gastro-esophageal reflux disease without esophagitis: Secondary | ICD-10-CM | POA: Diagnosis not present

## 2021-04-21 DIAGNOSIS — Z1211 Encounter for screening for malignant neoplasm of colon: Secondary | ICD-10-CM

## 2021-04-21 DIAGNOSIS — T17908S Unspecified foreign body in respiratory tract, part unspecified causing other injury, sequela: Secondary | ICD-10-CM

## 2021-04-21 MED ORDER — PANTOPRAZOLE SODIUM 40 MG PO TBEC: 40.0000 mg | DELAYED_RELEASE_TABLET | Freq: Every day | ORAL | 3 refills | Status: DC

## 2021-04-21 NOTE — Progress Notes (Signed)
Date:  04/21/2021   Name:  Kerri Preston   DOB:  11-29-1960   MRN:  [id number]   Chief Complaint: Follow-up (Ekg was normal and she followed up with Dr Nehemiah Massed and had a "scan"- cardiac CT scan was normal and was to follow up on the 23rd of this month "if necessary."- )  Gastroesophageal Reflux She complains of chest pain, choking, coughing, heartburn, a sore throat and water brash. She reports no abdominal pain, no belching, no dysphagia, no hoarse voice, no nausea, no stridor, no tooth decay or no wheezing. This is a recurrent problem. The current episode started more than 1 month ago. The problem has been waxing and waning. The heartburn duration is an hour. The heartburn is located in the substernum. The heartburn is of moderate intensity. The heartburn wakes her from sleep. The heartburn does not limit her activity. The heartburn changes with position. The symptoms are aggravated by certain foods, lying down and bending. Pertinent negatives include no fatigue or orthopnea. She has tried an antacid for the symptoms. The treatment provided mild relief.   Lab Results  Component Value Date   CREATININE 0.95 09/24/2020   BUN 13 09/24/2020   NA 140 09/24/2020   K 4.6 09/24/2020   CL 103 09/24/2020   CO2 22 09/24/2020   Lab Results  Component Value Date   CHOL 207 (H) 03/24/2021   HDL 48 03/24/2021   LDLCALC 125 (H) 03/24/2021   TRIG 190 (H) 03/24/2021   CHOLHDL 5.0 (H) 01/12/2018   No results found for: TSH No results found for: HGBA1C No results found for: WBC, HGB, HCT, MCV, PLT Lab Results  Component Value Date   ALT 21 09/24/2020   AST 22 09/24/2020   ALKPHOS 75 09/24/2020   BILITOT 0.7 09/24/2020     Review of Systems  Constitutional:  Negative for chills, fatigue and fever.  HENT:  Positive for sore throat. Negative for drooling, ear discharge, ear pain and hoarse voice.   Respiratory:  Positive for cough and choking. Negative for shortness of breath and wheezing.    Cardiovascular:  Positive for chest pain. Negative for palpitations and leg swelling.  Gastrointestinal:  Positive for heartburn. Negative for abdominal pain, blood in stool, constipation, diarrhea, dysphagia and nausea.  Endocrine: Negative for polydipsia.  Genitourinary:  Negative for dysuria, frequency, hematuria and urgency.  Musculoskeletal:  Negative for back pain, myalgias and neck pain.  Skin:  Negative for rash.  Allergic/Immunologic: Negative for environmental allergies.  Neurological:  Negative for dizziness and headaches.  Hematological:  Does not bruise/bleed easily.  Psychiatric/Behavioral:  Negative for suicidal ideas. The patient is not nervous/anxious.    Patient Active Problem List   Diagnosis Date Noted   Reactive airway disease, mild intermittent, with acute exacerbation 09/30/2016    No Known Allergies  Past Surgical History:  Procedure Laterality Date   BILATERAL TEMPOROMANDIBULAR JOINT ARTHROPLASTY     COLONOSCOPY  2012   cleared for 10 yrs   DILATION AND CURETTAGE OF UTERUS     TONSILLECTOMY     WISDOM TOOTH EXTRACTION      Social History   Tobacco Use   Smoking status: Never   Smokeless tobacco: Never  Vaping Use   Vaping Use: Never used  Substance Use Topics   Alcohol use: No    Alcohol/week: 0.0 standard drinks   Drug use: No     Medication list has been reviewed and updated.  Current Meds  Medication Sig  pravastatin (PRAVACHOL) 20 MG tablet Take 1 tablet daily    PHQ 2/9 Scores 03/24/2021 07/11/2020 07/17/2019 12/13/2018  PHQ - 2 Score 0 0 0 0  PHQ- 9 Score 3 0 0 0    GAD 7 : Generalized Anxiety Score 03/24/2021 07/11/2020 07/17/2019  Nervous, Anxious, on Edge 0 0 0  Control/stop worrying 0 0 0  Worry too much - different things 2 0 0  Trouble relaxing 0 0 0  Restless 0 0 0  Easily annoyed or irritable 0 0 0  Afraid - awful might happen 0 0 0  Total GAD 7 Score 2 0 0  Anxiety Difficulty Not difficult at all - -    BP  Readings from Last 3 Encounters:  04/21/21 102/62  03/24/21 120/80  09/24/20 130/80    Physical Exam Vitals and nursing note reviewed.  Constitutional:      Appearance: She is well-developed.  HENT:     Head: Normocephalic.     Right Ear: External ear normal.     Left Ear: External ear normal.  Eyes:     General: Lids are everted, no foreign bodies appreciated. No scleral icterus.       Left eye: No foreign body or hordeolum.     Conjunctiva/sclera: Conjunctivae normal.     Right eye: Right conjunctiva is not injected.     Left eye: Left conjunctiva is not injected.     Pupils: Pupils are equal, round, and reactive to light.  Neck:     Thyroid: No thyromegaly.     Vascular: No JVD.     Trachea: No tracheal deviation.  Cardiovascular:     Rate and Rhythm: Normal rate and regular rhythm.     Heart sounds: Normal heart sounds. No murmur heard.   No friction rub. No gallop.  Pulmonary:     Effort: Pulmonary effort is normal. No respiratory distress.     Breath sounds: Normal breath sounds. No wheezing or rales.  Abdominal:     General: Bowel sounds are normal. There is no distension.     Palpations: Abdomen is soft. There is no mass.     Tenderness: There is no abdominal tenderness. There is no guarding or rebound.     Hernia: No hernia is present.  Musculoskeletal:        General: No tenderness. Normal range of motion.     Cervical back: Normal range of motion and neck supple.  Lymphadenopathy:     Cervical: No cervical adenopathy.  Skin:    General: Skin is warm.     Findings: No rash.  Neurological:     Mental Status: She is alert and oriented to person, place, and time.     Cranial Nerves: No cranial nerve deficit.     Deep Tendon Reflexes: Reflexes normal.  Psychiatric:        Mood and Affect: Mood is not anxious or depressed.    Wt Readings from Last 3 Encounters:  04/21/21 178 lb (80.7 kg)  03/24/21 181 lb (82.1 kg)  09/24/20 181 lb (82.1 kg)    BP 102/62    Pulse 80   Ht 5\' 6"  (1.676 m)   Wt 178 lb (80.7 kg)   BMI 28.73 kg/m   Assessment and Plan:  1. Gastroesophageal reflux disease, unspecified whether esophagitis present New onset.  Patient has new onset of heartburn/reflux particularly bothersome at night when supine.  We will initiate pantoprazole 40 mg once a day. - pantoprazole (PROTONIX) 40  MG tablet; Take 1 tablet (40 mg total) by mouth daily.  Dispense: 30 tablet; Refill: 3 - Ambulatory referral to Gastroenterology  2. Aspiration into airway, sequela New onset patient is having episodes of aspiration that awakens her with choking and cough that is persistent.  Will refer to gastroenterology because of the possibility aspiration while sleeping in 2 evaluate for esophagitis and/or hiatal hernia. - pantoprazole (PROTONIX) 40 MG tablet; Take 1 tablet (40 mg total) by mouth daily.  Dispense: 30 tablet; Refill: 3 - Ambulatory referral to Gastroenterology  3. Colon cancer screening  - Ambulatory referral to Gastroenterology new onset.  Persistent.  Waxing and waning in intensity but always present.  Patient is noted particular bothersome symptoms at night.  We will initiate pantoprazole 40 mg once a day.  We will also refer to gastroenterology for the possibility of aspiration episodes.  Patient needs to have colonoscopy and last colonoscopy was done in 2012.

## 2021-04-21 NOTE — Patient Instructions (Signed)
Hiatal Hernia A hiatal hernia occurs when part of the stomach slides above the muscle that separates the abdomen from the chest (diaphragm). A person can be born with a hiatal hernia (congenital), or it may develop over time. In almost all cases of hiatal hernia, only the top part of the stomach pushes through the diaphragm. Many people have a hiatal hernia with no symptoms. The larger the hernia, the more likely it is that you will have symptoms. In some cases, a hiatal hernia allows stomach acid to flow back into the tube that carries food from your mouth to your stomach (esophagus). This may cause heartburn symptoms. Severe heartburn symptoms may mean that you have developed a condition called gastroesophageal reflux disease (GERD). What are the causes? This condition is caused by a weakness in the opening (hiatus) where the esophagus passes through the diaphragm to attach to the upper part of the stomach. A person may be born with a weakness in the hiatus, or a weakness can develop over time. What increases the risk? This condition is more likely to develop in: Older people. Age is a major risk factor for a hiatal hernia, especially if you are over the age of 3. Pregnant women. People who are overweight. People who have frequent constipation. What are the signs or symptoms? Symptoms of this condition usually develop in the form of GERD symptoms. Symptoms include: Heartburn. Belching. Indigestion. Trouble swallowing. Coughing or wheezing. Sore throat. Hoarseness. Chest pain. Nausea and vomiting. How is this diagnosed? This condition may be diagnosed during testing for GERD. Tests that may be done include: X-rays of your stomach or chest. An upper gastrointestinal (GI) series. This is an X-ray exam of your GI tract that is taken after you swallow a chalky liquid that shows up clearly on the X-ray. Endoscopy. This is a procedure to look into your stomach using a thin, flexible tube that  has a tiny camera and light on the end of it. How is this treated? This condition may be treated by: Dietary and lifestyle changes to help reduce GERD symptoms. Medicines. These may include: Over-the-counter antacids. Medicines that make your stomach empty more quickly. Medicines that block the production of stomach acid (H2 blockers). Stronger medicines to reduce stomach acid (proton pump inhibitors). Surgery to repair the hernia, if other treatments are not helping. If you have no symptoms, you may not need treatment. Follow these instructions at home: Lifestyle and activity Do not use any products that contain nicotine or tobacco, such as cigarettes and e-cigarettes. If you need help quitting, ask your health care provider. Try to achieve and maintain a healthy body weight. Avoid putting pressure on your abdomen. Anything that puts pressure on your abdomen increases the amount of acid that may be pushed up into your esophagus. Avoid bending over, especially after eating. Raise the head of your bed by putting blocks under the legs. This keeps your head and esophagus higher than your stomach. Do not wear tight clothing around your chest or stomach. Try not to strain when having a bowel movement, when urinating, or when lifting heavy objects. Eating and drinking Avoid foods that can worsen GERD symptoms. These may include: Fatty foods, like fried foods. Citrus fruits, like oranges or lemon. Other foods and drinks that contain acid, like orange juice or tomatoes. Spicy food. Chocolate. Eat frequent small meals instead of three large meals a day. This helps prevent your stomach from getting too full. Eat slowly. Do not lie down right after  eating. Do not eat 1-2 hours before bed. Do not drink beverages with caffeine. These include cola, coffee, cocoa, and tea. Do not drink alcohol. General instructions Take over-the-counter and prescription medicines only as told by your health care  provider. Keep all follow-up visits as told by your health care provider. This is important. Contact a health care provider if: Your symptoms are not controlled with medicines or lifestyle changes. You are having trouble swallowing. You have coughing or wheezing that will not go away. Get help right away if: Your pain is getting worse. Your pain spreads to your arms, neck, jaw, teeth, or back. You have shortness of breath. You sweat for no reason. You feel sick to your stomach (nauseous) or you vomit. You vomit blood. You have bright red blood in your stools. You have black, tarry stools. Summary A hiatal hernia occurs when part of the stomach slides above the muscle that separates the abdomen from the chest (diaphragm). A person may be born with a weakness in the hiatus, or a weakness can develop over time. Symptoms of hiatal hernia may include heartburn, trouble swallowing, or sore throat. Management of hiatal hernia includes eating frequent small meals instead of three large meals a day. Get help right away if you vomit blood, have bright red blood in your stools, or have black, tarry stools. This information is not intended to replace advice given to you by your health care provider. Make sure you discuss any questions you have with your health care provider. Document Revised: 06/19/2020 Document Reviewed: 06/19/2020 Elsevier Patient Education  2022 [employer].

## 2021-04-23 ENCOUNTER — Telehealth: Payer: Self-pay | Admitting: Family Medicine

## 2021-04-23 NOTE — Telephone Encounter (Signed)
The OTC medication the dr advised was for her acid reflux. Pt has been seen several times for this same issue, and he said Dr Ronnald Ramp told her of an OTC medication that would help with the acid reflux she has been experiencing at night.

## 2021-04-23 NOTE — Telephone Encounter (Signed)
Husband calling for the pt .  He said Dr Ronnald Ramp mentioned an OTC that pt could take at bedtime.  However, pt cannot remember what that was. Can you please call the husband and advise him the OTC medication  Dr Ronnald Ramp had mentioned. Pt is driving and cannot take a call.  He sais she had a pretty miserable night last night.

## 2021-04-23 NOTE — Telephone Encounter (Signed)
Called pt left VM to call back. Need more information. Is the medication for bedtime for sleep?  [facility] nurse may give results to patient if they return call to clinic, a CRM has been created.  KP

## 2021-04-24 ENCOUNTER — Telehealth: Payer: Self-pay

## 2021-04-24 ENCOUNTER — Other Ambulatory Visit: Payer: Self-pay

## 2021-04-24 DIAGNOSIS — R1013 Epigastric pain: Secondary | ICD-10-CM

## 2021-04-24 DIAGNOSIS — K219 Gastro-esophageal reflux disease without esophagitis: Secondary | ICD-10-CM

## 2021-04-24 NOTE — Progress Notes (Signed)
Placed referral to GI

## 2021-04-24 NOTE — Telephone Encounter (Signed)
Called pt and told her to go to ER if experiencing these symptoms again. Also gave her the name of famotidine to pick up

## 2021-04-27 ENCOUNTER — Encounter: Payer: Self-pay | Admitting: *Deleted

## 2021-04-28 ENCOUNTER — Encounter: Payer: Self-pay | Admitting: Family Medicine

## 2021-04-28 ENCOUNTER — Ambulatory Visit (INDEPENDENT_AMBULATORY_CARE_PROVIDER_SITE_OTHER): Payer: BC Managed Care – PPO | Admitting: Family Medicine

## 2021-04-28 ENCOUNTER — Other Ambulatory Visit: Payer: Self-pay

## 2021-04-28 VITALS — BP 110/80 | HR 72 | Ht 66.0 in | Wt 174.0 lb

## 2021-04-28 DIAGNOSIS — M7051 Other bursitis of knee, right knee: Secondary | ICD-10-CM | POA: Diagnosis not present

## 2021-04-28 DIAGNOSIS — K219 Gastro-esophageal reflux disease without esophagitis: Secondary | ICD-10-CM

## 2021-04-28 DIAGNOSIS — T17908S Unspecified foreign body in respiratory tract, part unspecified causing other injury, sequela: Secondary | ICD-10-CM

## 2021-04-28 DIAGNOSIS — R079 Chest pain, unspecified: Secondary | ICD-10-CM

## 2021-04-28 NOTE — Progress Notes (Signed)
Date:  04/28/2021   Name:  Kerri Preston   DOB:  September 16, 1960   MRN:  [id number]   Chief Complaint: Leg Pain (R) leg discomfort and feeling "heavy" bottom of feet "tingle")  Leg Pain  The incident occurred more than 1 week ago. There was no injury mechanism. The pain is present in the right leg (bilateral heaviness). The quality of the pain is described as aching. The pain is at a severity of 5/10. The pain is mild. The pain has been Constant since onset. Associated symptoms include tingling. Pertinent negatives include no inability to bear weight, loss of motion, loss of sensation, muscle weakness or numbness. Associated symptoms comments: Feet are tingling. She reports no foreign bodies present. The symptoms are aggravated by weight bearing and movement. She has tried NSAIDs for the symptoms. The treatment provided mild relief.   Lab Results  Component Value Date   CREATININE 0.95 09/24/2020   BUN 13 09/24/2020   NA 140 09/24/2020   K 4.6 09/24/2020   CL 103 09/24/2020   CO2 22 09/24/2020   Lab Results  Component Value Date   CHOL 207 (H) 03/24/2021   HDL 48 03/24/2021   LDLCALC 125 (H) 03/24/2021   TRIG 190 (H) 03/24/2021   CHOLHDL 5.0 (H) 01/12/2018   No results found for: TSH No results found for: HGBA1C No results found for: WBC, HGB, HCT, MCV, PLT Lab Results  Component Value Date   ALT 21 09/24/2020   AST 22 09/24/2020   ALKPHOS 75 09/24/2020   BILITOT 0.7 09/24/2020     Review of Systems  Constitutional:  Negative for chills, fatigue, fever and unexpected weight change.  HENT:  Negative for drooling, ear discharge, ear pain and sore throat.   Respiratory:  Negative for cough, shortness of breath and wheezing.   Cardiovascular:  Positive for chest pain. Negative for palpitations and leg swelling.  Gastrointestinal:  Negative for abdominal pain, blood in stool, constipation, diarrhea and nausea.  Endocrine: Negative for polydipsia.  Genitourinary:  Negative for  dysuria, frequency, hematuria and urgency.  Musculoskeletal:  Negative for back pain, myalgias and neck pain.  Skin:  Negative for rash.  Allergic/Immunologic: Negative for environmental allergies.  Neurological:  Positive for tingling. Negative for dizziness, numbness and headaches.  Hematological:  Does not bruise/bleed easily.  Psychiatric/Behavioral:  Negative for suicidal ideas. The patient is not nervous/anxious.    Patient Active Problem List   Diagnosis Date Noted   Reactive airway disease, mild intermittent, with acute exacerbation 09/30/2016    No Known Allergies  Past Surgical History:  Procedure Laterality Date   BILATERAL TEMPOROMANDIBULAR JOINT ARTHROPLASTY     COLONOSCOPY  2012   cleared for 10 yrs   DILATION AND CURETTAGE OF UTERUS     TONSILLECTOMY     WISDOM TOOTH EXTRACTION      Social History   Tobacco Use   Smoking status: Never   Smokeless tobacco: Never  Vaping Use   Vaping Use: Never used  Substance Use Topics   Alcohol use: No    Alcohol/week: 0.0 standard drinks   Drug use: No     Medication list has been reviewed and updated.  Current Meds  Medication Sig   famotidine (PEPCID) 20 MG tablet Take 20 mg by mouth at bedtime. As needed   pantoprazole (PROTONIX) 40 MG tablet Take 1 tablet (40 mg total) by mouth daily.   pravastatin (PRAVACHOL) 20 MG tablet Take 1 tablet daily  PHQ 2/9 Scores 03/24/2021 07/11/2020 07/17/2019 12/13/2018  PHQ - 2 Score 0 0 0 0  PHQ- 9 Score 3 0 0 0    GAD 7 : Generalized Anxiety Score 03/24/2021 07/11/2020 07/17/2019  Nervous, Anxious, on Edge 0 0 0  Control/stop worrying 0 0 0  Worry too much - different things 2 0 0  Trouble relaxing 0 0 0  Restless 0 0 0  Easily annoyed or irritable 0 0 0  Afraid - awful might happen 0 0 0  Total GAD 7 Score 2 0 0  Anxiety Difficulty Not difficult at all - -    BP Readings from Last 3 Encounters:  04/21/21 102/62  03/24/21 120/80  09/24/20 130/80    Physical  Exam Vitals and nursing note reviewed.  Constitutional:      Appearance: She is well-developed.  HENT:     Head: Normocephalic.     Right Ear: Tympanic membrane and external ear normal.     Left Ear: Tympanic membrane and external ear normal.     Nose: Nose normal.     Mouth/Throat:     Mouth: Mucous membranes are moist.  Eyes:     General: Lids are everted, no foreign bodies appreciated. No scleral icterus.       Left eye: No foreign body or hordeolum.     Conjunctiva/sclera: Conjunctivae normal.     Right eye: Right conjunctiva is not injected.     Left eye: Left conjunctiva is not injected.     Pupils: Pupils are equal, round, and reactive to light.  Neck:     Thyroid: No thyromegaly.     Vascular: No JVD.     Trachea: No tracheal deviation.  Cardiovascular:     Rate and Rhythm: Normal rate and regular rhythm.     Pulses:          Dorsalis pedis pulses are 2+ on the right side and 2+ on the left side.       Posterior tibial pulses are 2+ on the right side and 2+ on the left side.     Heart sounds: Normal heart sounds, S1 normal and S2 normal. No murmur heard. No systolic murmur is present.  No diastolic murmur is present.    No friction rub. No gallop. No S3 or S4 sounds.  Pulmonary:     Effort: Pulmonary effort is normal. No respiratory distress.     Breath sounds: Normal breath sounds. No decreased breath sounds, wheezing, rhonchi or rales.  Abdominal:     General: Bowel sounds are normal.     Palpations: Abdomen is soft. There is no mass.     Tenderness: There is no abdominal tenderness. There is no guarding or rebound.  Musculoskeletal:        General: Normal range of motion.     Cervical back: Normal range of motion and neck supple.     Lumbar back: No spasms or tenderness.     Left knee: No tenderness.     Right lower leg: No edema.     Left lower leg: Tenderness present. No bony tenderness. No edema.       Legs:     Comments: Tender pes anserine bursitis   Lymphadenopathy:     Cervical: No cervical adenopathy.  Skin:    General: Skin is warm.     Findings: No rash.  Neurological:     Mental Status: She is alert and oriented to person, place, and time.  Cranial Nerves: No cranial nerve deficit.     Deep Tendon Reflexes: Reflexes normal.  Psychiatric:        Mood and Affect: Mood is not anxious or depressed.    Wt Readings from Last 3 Encounters:  04/21/21 178 lb (80.7 kg)  03/24/21 181 lb (82.1 kg)  09/24/20 181 lb (82.1 kg)    There were no vitals taken for this visit.  Assessment and Plan:  1. Pes anserinus bursitis of right knee Relatively new onset intermittent pain for about a year the medial aspect just distal to the medial joint line.  There is swelling and tenderness consistent with the Pez anserine bursa.  With the possibility of reflux were reluctant to use an NSAID I have suggested that she use topical Voltaren.  Patient is also been given information to use ice instead of heat.  2. Gastroesophageal reflux disease, unspecified whether esophagitis present Chronic.  Controlled.  Stable.  Continue famotidine 20 mg once a day.  Recheck in 6 months  3. Aspiration into airway, sequela Vernard Gambles is waking up and has aspirated with some sequelae afterwards and at this point time she is stable  4. Chest pain, unspecified type Review of cardiology evaluation notes that she had a coronary calcium score which was low the puts her at very low risk.  However she had another one of her episodes of substernal chest discomfort that was intense radiating to her back.  I have suggested to her if this happens again that she needs to be immediately evaluated and if it does continue that we may need to be reevaluated at the cardiology level.

## 2021-04-28 NOTE — Patient Instructions (Signed)
Pes Anserine Bursitis Rehab Ask your health care provider which exercises are safe for you. Do exercises exactly as told by your health care provider and adjust them as directed. It is normal to feel mild stretching, pulling, tightness, or discomfort as you do these exercises. Stop right away if you feel sudden pain or your pain gets worse. Do not begin these exercises until told by your health care provider. Stretching and range-of-motion exercises These exercises warm up your muscles and joints and improve the movement and flexibility of your knee. These exercises also help to relieve pain and stiffness. Hamstring stretch, doorway  Lie on your back in front of a doorway with your left / right leg resting against the wall and your other leg flat on the floor in the doorway. There should be a slight bend in your left / right knee. Straighten your left / right knee. You should feel a stretch behind your knee or thigh (hamstring). If you do not, scoot your buttocks closer to the door. Hold this position for __________ seconds. Repeat __________ times. Complete this exercise __________ times a day. Seated stretch This exercise is sometimes called hamstrings and adductors stretch. Sit on the floor with your legs stretched wide. Keep your knees straight during this exercise. Keeping your head and back in a straight line, bend at your waist to reach for your left foot (position A). You should feel a stretch in your right inner thigh (adductors). Hold for __________ seconds. Then slowly return to the upright position. Keeping your head and back in a straight line, bend at your waist to reach forward (position B). You should feel a stretch behind both of your thighs or knees (hamstrings). Hold for __________ seconds. Then slowly return to the upright position. Keeping your head and back in a straight line, bend at your waist to reach for your right foot (position C). You should feel a stretch in your left  inner thigh (adductors). Hold for __________ seconds. Then slowly return to the upright position. Repeat __________ times. Complete this exercise __________ times a day. Quadriceps, prone  Lie on your abdomen on a firm surface, such as a bed or padded floor (prone position). Bend your left / right knee and hold your ankle. If you cannot reach your ankle or pant leg, loop a belt around your foot and grab the belt instead. Gently pull your heel toward your buttocks. Your knee should not slide out to the side. You should feel a stretch in the front of your thigh and knee (quadriceps). Hold this position for __________ seconds. Repeat __________ times. Complete this exercise __________ times a day. Strengthening exercises These exercises build strength and endurance in your knee and hip. Endurance is the ability to use your muscles for a long time, even after they get tired. Quadriceps terminal knee extension Secure a long loop of rubber exercise band around a sturdy object like a table leg. Put the band behind your left / right knee. Step back from where the band is secured to put tension on the band. Slowly bend your left / right knee. Keep your left / right foot flat on the floor. Tighten the muscle in the front of your thigh (quadriceps) and push back against the band to straighten your knee until it is completely straight (terminal extension). Hold this position for __________ seconds. Return to having your left / right knee bent. Repeat __________ times. Complete this exercise __________ times a day. Straight leg raises, side-lying This  exercise strengthens the muscles that rotate the leg at the hip and move it away from your body (hip abductors). Lie on your side with your left / right leg in the top position. Lie so your head, shoulder, hip, and knee line up. You may bend your bottom knee to help you balance. Lift your top leg 4-6 inches (10-15 cm) while keeping your toes pointed straight  ahead. Hold this position for __________ seconds. Slowly lower your leg to the starting position. Allow your muscles to relax completely after each repetition. Repeat __________ times. Complete this exercise __________ times a day. This information is not intended to replace advice given to you by your health care provider. Make sure you discuss any questions you have with your health care provider. Document Revised: 11/09/2018 Document Reviewed: 05/11/2018 Elsevier Patient Education  2022 [place]. Pes Anserine Bursitis The pes anserine is an area on the inside of your knee, just below the joint, that is cushioned by a fluid-filled sac (bursa). Pes anserine bursitis is a condition that happens when the bursa gets swollen and irritated. The condition causes knee pain. What are the causes? This condition may be caused by: Making the same movement over and over. A direct hit (trauma) to the inside of the leg. What increases the risk? You are more likely to develop this condition if you: Are a runner. Play sports that involve a lot of running and quick side-to-side movements (cutting). Are an athlete who plays contact sports. Swim using an inward angle of the knee, such as with the breaststroke. Have tight hamstring muscles. Are a woman. Are overweight. Have flat feet. Have diabetes or osteoarthritis. What are the signs or symptoms? Symptoms of this condition include: Knee pain that gets better with rest and worse with activities like climbing stairs, walking, running, or getting in and out of a chair. Swelling. Warmth. Tenderness when pressing at the inside of the lower leg, just below the knee. How is this diagnosed? This condition may be diagnosed based on: Your symptoms. Your medical history. A physical exam. During your physical exam, your health care provider will press on the tendon attachment to see if you feel pain. Your health care provider may also check your hip and  knee motion and strength. Tests to check for swelling and fluid buildup in the bursa and to look at muscles, bones, and tendons. These tests might include: X-rays. MRI. Ultrasound. How is this treated? This condition may be treated by: Resting your knee. You may be told to raise (elevate) your knee while resting. Avoiding activities that cause pain. Icing the inside of your knee. Sleeping with a pillow between your knees. This will cushion your injured knee. Taking medicine by mouth (orally) to reduce pain and swelling or having medicine injected into your knee. Doing strengthening and stretching exercises (physical therapy). If these treatments do not work or if the condition keeps coming back, you may need to have surgery to remove the bursa. Follow these instructions at home: Managing pain, stiffness, and swelling  If directed, put ice on the injured area. Put ice in a plastic bag. Place a towel between your skin and the bag. Leave the ice on for 20 minutes, 2-3 times a day. Elevate the injured area above the level of your heart while you are sitting or lying down. Activity Return to your normal activities as told by your health care provider. Ask your health care provider what activities are safe for you. Do exercises as  told by your health care provider. General instructions Take over-the-counter and prescription medicines only as told by your health care provider. Sleep with a pillow between your knees. Do not use any products that contain nicotine or tobacco, such as cigarettes, e-cigarettes, and chewing tobacco. These can delay healing. If you need help quitting, ask your health care provider. If you are overweight, work with your health care provider and a dietitian to set a weight-loss goal that is healthy and reasonable for you. Keep all follow-up visits as told by your health care provider. This is important. How is this prevented? When exercising, make sure that  you: Warm up and stretch before being active. Cool down and stretch after being active. Give your body time to rest between periods of activity. Use equipment that fits you. Are safe and responsible while being active to avoid falls. Do at least 150 minutes of moderate-intensity exercise each week, such as brisk walking or water aerobics. Maintain physical fitness, including: Strength. Flexibility. Cardiovascular fitness. Endurance. Maintain a healthy weight. Contact a health care provider if: Your symptoms do not improve. Your symptoms get worse. Summary Pes anserine bursitis is a condition that happens when the fluid-filled sac (bursa) at the inside of your knee gets swollen and irritated. The condition causes knee pain. Treatment for pes anserine bursitis may include resting your knee, icing the inside of your knee, sleeping with a pillow between your knees, taking medicine by mouth or by injection, and doing strengthening and stretching exercises (physical therapy). Follow instructions for managing pain, stiffness, and swelling. Take over-the-counter and prescription medicines only as told by your health care provider. This information is not intended to replace advice given to you by your health care provider. Make sure you discuss any questions you have with your health care provider. Document Revised: 11/09/2018 Document Reviewed: 12/28/2017 Elsevier Patient Education  [place].

## 2021-06-07 ENCOUNTER — Other Ambulatory Visit: Payer: Self-pay | Admitting: Family Medicine

## 2021-06-07 DIAGNOSIS — E7800 Pure hypercholesterolemia, unspecified: Secondary | ICD-10-CM

## 2021-06-07 NOTE — Telephone Encounter (Signed)
Change of pharmacy Requested Prescriptions  Pending Prescriptions Disp Refills  . pravastatin (PRAVACHOL) 20 MG tablet [Pharmacy Med Name: PRAVASTATIN 20MG  TABLETS] 90 tablet 0    Sig: TAKE 1 TABLET(20 MG) BY MOUTH DAILY     Cardiovascular:  Antilipid - Statins Failed - 06/07/2021  9:06 AM      Failed - Total Cholesterol in normal range and within 360 days    Cholesterol, Total  Date Value Ref Range Status  03/24/2021 207 (H) 100 - 199 mg/dL Final         Failed - LDL in normal range and within 360 days    LDL Chol Calc (NIH)  Date Value Ref Range Status  03/24/2021 125 (H) 0 - 99 mg/dL Final         Failed - Triglycerides in normal range and within 360 days    Triglycerides  Date Value Ref Range Status  03/24/2021 190 (H) 0 - 149 mg/dL Final         Passed - HDL in normal range and within 360 days    HDL  Date Value Ref Range Status  03/24/2021 48 >39 mg/dL Final         Passed - Patient is not pregnant      Passed - Valid encounter within last 12 months    Recent Outpatient Visits          1 month ago Pes anserinus bursitis of right knee   [facility] Juline Patch, MD   1 month ago Gastroesophageal reflux disease, unspecified whether esophagitis present   [facility] Juline Patch, MD   2 months ago Chest pain, unspecified type   [facility] Juline Patch, MD   8 months ago Eustachian tube dysfunction, bilateral   [facility] Juline Patch, MD   11 months ago Acute non-recurrent maxillary sinusitis   [facility] Juline Patch, MD      Future Appointments            In 3 months Juline Patch, MD [facility]

## 2021-06-17 ENCOUNTER — Ambulatory Visit (INDEPENDENT_AMBULATORY_CARE_PROVIDER_SITE_OTHER): Payer: BC Managed Care – PPO | Admitting: Advanced Practice Midwife

## 2021-06-17 ENCOUNTER — Other Ambulatory Visit (HOSPITAL_COMMUNITY)
Admission: RE | Admit: 2021-06-17 | Discharge: 2021-06-17 | Disposition: A | Discharge status: Home / Self Care | Payer: BC Managed Care – PPO | Source: Ambulatory Visit | Attending: Advanced Practice Midwife | Admitting: Advanced Practice Midwife

## 2021-06-17 ENCOUNTER — Encounter: Payer: Self-pay | Admitting: Advanced Practice Midwife

## 2021-06-17 ENCOUNTER — Other Ambulatory Visit: Payer: Self-pay

## 2021-06-17 VITALS — BP 142/85 | Ht 66.0 in | Wt 180.0 lb

## 2021-06-17 DIAGNOSIS — Z01419 Encounter for gynecological examination (general) (routine) without abnormal findings: Secondary | ICD-10-CM | POA: Insufficient documentation

## 2021-06-17 DIAGNOSIS — Z124 Encounter for screening for malignant neoplasm of cervix: Secondary | ICD-10-CM | POA: Diagnosis not present

## 2021-06-17 DIAGNOSIS — Z1239 Encounter for other screening for malignant neoplasm of breast: Secondary | ICD-10-CM | POA: Diagnosis not present

## 2021-06-17 DIAGNOSIS — Z23 Encounter for immunization: Secondary | ICD-10-CM | POA: Diagnosis not present

## 2021-06-17 NOTE — Progress Notes (Signed)
Gynecology Annual Exam  PCP: Juline Patch, MD  Chief Complaint:  Chief Complaint  Patient presents with   Annual Exam    History of Present Illness:Patient is a 60 y.o. [phone] presents for annual exam. The patient has complaint today of occasional/approximately twice a month at night throbbing pain at night. She thinks it is rectal although sometimes she feels it could be uterine or bladder. She is scheduled to have a colonoscopy in the next couple months. Discussed it could be internal hemorrhoids and to consider gyn imaging for worsening symptoms.    LMP: No LMP recorded. Patient is postmenopausal.  Postcoital Bleeding: no   The patient is sexually active. She denies dyspareunia.  The patient does perform self breast exams.  There is no notable family history of breast or ovarian cancer in her family.  The patient wears seatbelts: yes.   The patient has regular exercise:  she walks daily, she admits healthy diet and hydration, she usually has 4 hours of sleep that she still attributes to life stressors. Her husband was recently diagnosed with lupus .    The patient denies current symptoms of depression.     Review of Systems: Review of Systems  Constitutional:  Negative for chills and fever.  HENT:  Negative for congestion, ear discharge, ear pain, hearing loss, sinus pain and sore throat.   Eyes:  Negative for blurred vision and double vision.  Respiratory:  Negative for cough, shortness of breath and wheezing.   Cardiovascular:  Negative for chest pain, palpitations and leg swelling.  Gastrointestinal:  Negative for abdominal pain, blood in stool, constipation, diarrhea, heartburn, melena, nausea and vomiting.       Positive for occasional nocturnal throbbing pain  Genitourinary:  Negative for dysuria, flank pain, frequency, hematuria and urgency.  Musculoskeletal:  Negative for back pain, joint pain and myalgias.  Skin:  Negative for itching and rash.  Neurological:   Negative for dizziness, tingling, tremors, sensory change, speech change, focal weakness, seizures, loss of consciousness, weakness and headaches.  Endo/Heme/Allergies:  Negative for environmental allergies. Does not bruise/bleed easily.  Psychiatric/Behavioral:  Negative for depression, hallucinations, memory loss, substance abuse and suicidal ideas. The patient is not nervous/anxious and does not have insomnia.    Past Medical History:  Patient Active Problem List   Diagnosis Date Noted   Hyperlipidemia, mixed 04/02/2021   Stable angina pectoris ([place]) 04/02/2021   Reactive airway disease, mild intermittent, with acute exacerbation 09/30/2016    symbicort     Past Surgical History:  Past Surgical History:  Procedure Laterality Date   BILATERAL TEMPOROMANDIBULAR JOINT ARTHROPLASTY     COLONOSCOPY  2012   cleared for 10 yrs   DILATION AND CURETTAGE OF UTERUS     TONSILLECTOMY     WISDOM TOOTH EXTRACTION      Gynecologic History:  No LMP recorded. Patient is postmenopausal. Last Pap: 2017 Results were:  no abnormalities  Last mammogram: 2021 Results were: BI-RAD I  Obstetric History: [id number]  Family History:  Family History  Problem Relation Age of Onset   Hypertension Father    Breast cancer Paternal Aunt        68's   Osteoarthritis Mother    Osteoporosis Mother     Social History:  Social History   Socioeconomic History   Marital status: Married    Spouse name: Not on file   Number of children: Not on file   Years of education: Not on file  Highest education level: Not on file  Occupational History   Not on file  Tobacco Use   Smoking status: Never   Smokeless tobacco: Never  Vaping Use   Vaping Use: Never used  Substance and Sexual Activity   Alcohol use: No    Alcohol/week: 0.0 standard drinks   Drug use: No   Sexual activity: Yes    Birth control/protection: Post-menopausal  Other Topics Concern   Not on file  Social History Narrative   Not on  file   Social Determinants of Health   Financial Resource Strain: Not on file  Food Insecurity: Not on file  Transportation Needs: Not on file  Physical Activity: Not on file  Stress: Not on file  Social Connections: Not on file  Intimate Partner Violence: Not on file    Allergies:  No Known Allergies  Medications: Prior to Admission medications   Medication Sig Start Date End Date Taking? Authorizing Provider  pantoprazole (PROTONIX) 40 MG tablet Take 1 tablet (40 mg total) by mouth daily. 04/21/21  Yes Juline Patch, MD  pravastatin (PRAVACHOL) 20 MG tablet TAKE 1 TABLET(20 MG) BY MOUTH DAILY 06/07/21  Yes Juline Patch, MD  famotidine (PEPCID) 20 MG tablet Take 20 mg by mouth at bedtime. As needed Patient not taking: Reported on 06/17/2021    [provider]    Physical Exam Vitals: Blood pressure (!) 142/85, height 5\' 6"  (1.676 m), weight 180 lb (81.6 kg).  General: NAD HEENT: normocephalic, anicteric Thyroid: no enlargement, no palpable nodules Pulmonary: No increased work of breathing, CTAB Cardiovascular: RRR, distal pulses 2+ Breast: Breast symmetrical, no tenderness, no palpable nodules or masses, no skin or nipple retraction present, no nipple discharge.  No axillary or supraclavicular lymphadenopathy. Abdomen: NABS, soft, non-tender, non-distended.  Umbilicus without lesions.  No hepatomegaly, splenomegaly or masses palpable. No evidence of hernia  Genitourinary:  External: Normal external female genitalia.  Normal urethral meatus, normal Bartholin's and Skene's glands.    Vagina: Normal vaginal mucosa, no evidence of prolapse. Minimal muscle strength   Cervix: Grossly normal in appearance, no bleeding, no CMT  Uterus: Non-enlarged, mobile, normal contour.    Adnexa: ovaries non-enlarged, no adnexal masses  Rectal: deferred  Lymphatic: no evidence of inguinal lymphadenopathy Extremities: no edema, erythema, or tenderness Neurologic: Grossly  intact Psychiatric: mood appropriate, affect full     Assessment: 60 y.o. [id number] routine annual exam  Plan: Problem List Items Addressed This Visit   None Visit Diagnoses     Well woman exam with routine gynecological exam    -  Primary   Relevant Orders   Cytology - PAP   MM 3D SCREEN BREAST BILATERAL   Need for immunization against influenza       Relevant Orders   Flu Vaccine QUAD 69mo+IM (Fluarix, Fluzone & Alfiuria Quad PF) (Completed)   Screening for cervical cancer       Relevant Orders   Cytology - PAP   Breast screening       Relevant Orders   MM 3D SCREEN BREAST BILATERAL       1) Mammogram - recommend yearly screening mammogram.  Mammogram Was ordered today  2) STI screening  was not offered and therefore not obtained  3) ASCCP guidelines and rationale discussed.  Patient opts for every 5 years screening interval  4) Osteoporosis  - per USPTF routine screening DEXA at age 54  Consider FDA-approved medical therapies in postmenopausal women and men aged 25 years and older,  based on the following: a) A hip or vertebral (clinical or morphometric) fracture b) T-score ? -2.5 at the femoral neck or spine after appropriate evaluation to exclude secondary causes C) Low bone mass (T-score between -1.0 and -2.5 at the femoral neck or spine) and a 10-year probability of a hip fracture ? 3% or a 10-year probability of a major osteoporosis-related fracture ? 20% based on the [place]-adapted WHO algorithm   5) Routine healthcare maintenance including cholesterol, diabetes screening discussed managed by PCP  6) Colonoscopy is scheduled.  Screening recommended starting at age 51 for average risk individuals, age 16 for individuals deemed at increased risk (including African Americans) and recommended to continue until age 68.  For patient age 50-85 individualized approach is recommended.  Gold standard screening is via colonoscopy, Cologuard screening is an acceptable alternative  for patient unwilling or unable to undergo colonoscopy.  "Colorectal cancer screening for average?risk adults: 2018 guideline update from the [place]: A Cancer Journal for Clinicians: Dec 29, 2016   7) Return in about 1 year (around 06/17/2022) for annual established gyn.    Christean Leaf, CNM [facility] 06/17/21, 9:09 AM

## 2021-06-24 LAB — CYTOLOGY - PAP
Comment: NEGATIVE
Diagnosis: NEGATIVE
High risk HPV: NEGATIVE

## 2021-06-29 ENCOUNTER — Ambulatory Visit
Admission: RE | Admit: 2021-06-29 | Discharge: 2021-06-29 | Disposition: A | Discharge status: Home / Self Care | Payer: BC Managed Care – PPO | Source: Ambulatory Visit | Attending: Advanced Practice Midwife | Admitting: Advanced Practice Midwife

## 2021-06-29 ENCOUNTER — Other Ambulatory Visit: Payer: Self-pay

## 2021-06-29 ENCOUNTER — Other Ambulatory Visit: Payer: Self-pay | Admitting: Advanced Practice Midwife

## 2021-06-29 DIAGNOSIS — R928 Other abnormal and inconclusive findings on diagnostic imaging of breast: Secondary | ICD-10-CM | POA: Insufficient documentation

## 2021-06-29 DIAGNOSIS — Z1231 Encounter for screening mammogram for malignant neoplasm of breast: Secondary | ICD-10-CM | POA: Insufficient documentation

## 2021-06-29 DIAGNOSIS — N6489 Other specified disorders of breast: Secondary | ICD-10-CM

## 2021-06-29 DIAGNOSIS — Z01419 Encounter for gynecological examination (general) (routine) without abnormal findings: Secondary | ICD-10-CM

## 2021-06-29 DIAGNOSIS — Z1239 Encounter for other screening for malignant neoplasm of breast: Secondary | ICD-10-CM

## 2021-06-30 ENCOUNTER — Telehealth: Payer: Self-pay

## 2021-06-30 NOTE — Telephone Encounter (Signed)
Spoke w/patient. Advised to contact [place] scheduling [phone] ask for [facility]. She does the scheduling for f/u views.

## 2021-06-30 NOTE — Telephone Encounter (Signed)
Patient had annual w/[facility] in [place] last week. She had her mammogram on Monday. She did receive a my chart message about something susupcious in left breast.  Her insurance is changing as of 07/02/21. She may not have insurance after tomorrow. She is trying to be seen/get appointment by tomorrow.[phone]

## 2021-07-13 ENCOUNTER — Other Ambulatory Visit: Payer: Self-pay

## 2021-07-13 ENCOUNTER — Ambulatory Visit
Admission: RE | Admit: 2021-07-13 | Discharge: 2021-07-13 | Disposition: A | Discharge status: Home / Self Care | Payer: Self-pay | Source: Ambulatory Visit | Attending: Advanced Practice Midwife | Admitting: Advanced Practice Midwife

## 2021-07-13 DIAGNOSIS — N6489 Other specified disorders of breast: Secondary | ICD-10-CM

## 2021-07-13 DIAGNOSIS — R928 Other abnormal and inconclusive findings on diagnostic imaging of breast: Secondary | ICD-10-CM

## 2021-07-14 ENCOUNTER — Other Ambulatory Visit: Payer: Self-pay | Admitting: Advanced Practice Midwife

## 2021-07-14 DIAGNOSIS — R928 Other abnormal and inconclusive findings on diagnostic imaging of breast: Secondary | ICD-10-CM

## 2021-07-14 DIAGNOSIS — N6320 Unspecified lump in the left breast, unspecified quadrant: Secondary | ICD-10-CM

## 2021-07-21 ENCOUNTER — Ambulatory Visit: Payer: Self-pay

## 2021-07-28 ENCOUNTER — Other Ambulatory Visit: Payer: Self-pay

## 2021-07-28 ENCOUNTER — Ambulatory Visit
Admission: RE | Admit: 2021-07-28 | Discharge: 2021-07-28 | Disposition: A | Discharge status: Home / Self Care | Payer: Self-pay | Source: Ambulatory Visit | Attending: Advanced Practice Midwife | Admitting: Advanced Practice Midwife

## 2021-07-28 DIAGNOSIS — N6320 Unspecified lump in the left breast, unspecified quadrant: Secondary | ICD-10-CM | POA: Insufficient documentation

## 2021-07-28 DIAGNOSIS — R928 Other abnormal and inconclusive findings on diagnostic imaging of breast: Secondary | ICD-10-CM

## 2021-07-28 HISTORY — PX: BREAST BIOPSY: SHX20

## 2021-07-29 LAB — SURGICAL PATHOLOGY

## 2021-08-13 DIAGNOSIS — K219 Gastro-esophageal reflux disease without esophagitis: Secondary | ICD-10-CM | POA: Insufficient documentation

## 2021-08-14 ENCOUNTER — Other Ambulatory Visit: Payer: Self-pay

## 2021-08-14 DIAGNOSIS — Z1211 Encounter for screening for malignant neoplasm of colon: Secondary | ICD-10-CM

## 2021-08-20 LAB — FECAL OCCULT BLOOD, IMMUNOCHEMICAL
Fecal Occult Bld: NEGATIVE
IFOBT: NEGATIVE

## 2021-09-03 ENCOUNTER — Telehealth: Payer: Self-pay | Admitting: Family Medicine

## 2021-09-03 NOTE — Telephone Encounter (Signed)
Patient viewed her fit test results on My Chart and it reflects negative, patient unsure if this means she needs a colonoscopy she would like further clarification.

## 2021-09-03 NOTE — Telephone Encounter (Signed)
Called pt let her know that a negative fit test meant everything was good. Told pt she will repeat test in 1 year. Pt verbalize dudnertsanding.  KP

## 2021-09-23 ENCOUNTER — Ambulatory Visit: Payer: Self-pay | Admitting: Family Medicine

## 2022-01-20 ENCOUNTER — Other Ambulatory Visit: Payer: Self-pay | Admitting: Family Medicine

## 2022-01-20 DIAGNOSIS — E7800 Pure hypercholesterolemia, unspecified: Secondary | ICD-10-CM

## 2022-02-23 ENCOUNTER — Other Ambulatory Visit: Payer: Self-pay | Admitting: Family Medicine

## 2022-02-23 DIAGNOSIS — E7800 Pure hypercholesterolemia, unspecified: Secondary | ICD-10-CM

## 2022-02-26 ENCOUNTER — Ambulatory Visit (INDEPENDENT_AMBULATORY_CARE_PROVIDER_SITE_OTHER): Payer: Self-pay | Admitting: Family Medicine

## 2022-02-26 ENCOUNTER — Encounter: Payer: Self-pay | Admitting: Family Medicine

## 2022-02-26 VITALS — BP 130/80 | HR 76 | Ht 66.0 in | Wt 172.0 lb

## 2022-02-26 DIAGNOSIS — E7800 Pure hypercholesterolemia, unspecified: Secondary | ICD-10-CM

## 2022-02-26 DIAGNOSIS — R69 Illness, unspecified: Secondary | ICD-10-CM

## 2022-02-26 DIAGNOSIS — K219 Gastro-esophageal reflux disease without esophagitis: Secondary | ICD-10-CM

## 2022-02-26 MED ORDER — PRAVASTATIN SODIUM 20 MG PO TABS
20.0000 mg | ORAL_TABLET | Freq: Every day | ORAL | 1 refills | Status: DC
Start: 2022-02-26 — End: 2022-08-30

## 2022-02-26 MED ORDER — PANTOPRAZOLE SODIUM 40 MG PO TBEC
40.0000 mg | DELAYED_RELEASE_TABLET | Freq: Every day | ORAL | 1 refills | Status: DC
Start: 2022-02-26 — End: 2022-08-30

## 2022-02-26 NOTE — Progress Notes (Signed)
Date:  02/26/2022   Name:  Kerri Preston   DOB:  [birth year]   MRN:  [id number]   Chief Complaint: Hyperlipidemia and Gastroesophageal Reflux  Hyperlipidemia This is a chronic problem. The current episode started more than 1 year ago. The problem is controlled. Recent lipid tests were reviewed and are normal. She has no history of chronic renal disease, diabetes, hypothyroidism, liver disease, obesity or nephrotic syndrome. Pertinent negatives include no chest pain. Current antihyperlipidemic treatment includes statins. The current treatment provides moderate improvement of lipids. There are no compliance problems.  There are no known risk factors for coronary artery disease.  Gastroesophageal Reflux She reports no abdominal pain, no belching, no chest pain, no choking, no coughing, no dysphagia, no early satiety, no globus sensation, no heartburn, no hoarse voice, no nausea, no sore throat, no stridor, no tooth decay, no water brash or no wheezing. This is a chronic problem. The current episode started more than 1 year ago. The problem occurs frequently. She has tried a PPI for the symptoms. The treatment provided moderate relief.    Lab Results  Component Value Date   NA 140 09/24/2020   K 4.6 09/24/2020   CO2 22 09/24/2020   GLUCOSE 84 09/24/2020   BUN 13 09/24/2020   CREATININE 0.95 09/24/2020   CALCIUM 9.6 09/24/2020   GFRNONAA 65 09/24/2020   Lab Results  Component Value Date   CHOL 207 (H) 03/24/2021   HDL 48 03/24/2021   LDLCALC 125 (H) 03/24/2021   TRIG 190 (H) 03/24/2021   CHOLHDL 5.0 (H) 01/12/2018   No results found for: "TSH" No results found for: "HGBA1C" No results found for: "WBC", "HGB", "HCT", "MCV", "PLT" Lab Results  Component Value Date   ALT 21 09/24/2020   AST 22 09/24/2020   ALKPHOS 75 09/24/2020   BILITOT 0.7 09/24/2020   No results found for: "25OHVITD2", "25OHVITD3", "VD25OH"   Review of Systems  HENT:  Negative for hoarse voice and sore  throat.   Respiratory:  Negative for cough, choking and wheezing.   Cardiovascular:  Negative for chest pain, palpitations and leg swelling.  Gastrointestinal:  Negative for abdominal distention, abdominal pain, blood in stool, constipation, diarrhea, dysphagia, heartburn and nausea.  Endocrine: Negative for polydipsia and polyuria.  Genitourinary:  Negative for difficulty urinating and dyspareunia.    Patient Active Problem List   Diagnosis Date Noted   Hyperlipidemia, mixed 04/02/2021   Stable angina pectoris ([place]) 04/02/2021   Reactive airway disease, mild intermittent, with acute exacerbation 09/30/2016    No Known Allergies  Past Surgical History:  Procedure Laterality Date   BILATERAL TEMPOROMANDIBULAR JOINT ARTHROPLASTY     BREAST BIOPSY Left 07/28/2021   U/S bx 3:00, venus clip, path pending   COLONOSCOPY  2012   cleared for 10 yrs   DILATION AND CURETTAGE OF UTERUS     TONSILLECTOMY     WISDOM TOOTH EXTRACTION      Social History   Tobacco Use   Smoking status: Never   Smokeless tobacco: Never  Vaping Use   Vaping Use: Never used  Substance Use Topics   Alcohol use: No    Alcohol/week: 0.0 standard drinks of alcohol   Drug use: No     Medication list has been reviewed and updated.  Current Meds  Medication Sig   pantoprazole (PROTONIX) 40 MG tablet Take 1 tablet (40 mg total) by mouth daily.   pravastatin (PRAVACHOL) 20 MG tablet TAKE ONE TABLET BY MOUTH  ONE TIME DAILY       02/26/2022   10:23 AM 03/24/2021    8:09 AM 07/11/2020   12:17 PM 07/17/2019    9:43 AM  GAD 7 : Generalized Anxiety Score  Nervous, Anxious, on Edge 0 0 0 0  Control/stop worrying 0 0 0 0  Worry too much - different things 0 2 0 0  Trouble relaxing 0 0 0 0  Restless 0 0 0 0  Easily annoyed or irritable 0 0 0 0  Afraid - awful might happen 0 0 0 0  Total GAD 7 Score 0 2 0 0  Anxiety Difficulty Not difficult at all Not difficult at all         02/26/2022   10:23 AM  03/24/2021    8:09 AM 07/11/2020   12:17 PM  Depression screen PHQ 2/9  Decreased Interest 0 0 0  Down, Depressed, Hopeless 0 0 0  PHQ - 2 Score 0 0 0  Altered sleeping 0 1 0  Tired, decreased energy 0 1 0  Change in appetite 0 0 0  Feeling bad or failure about yourself  0 0 0  Trouble concentrating 0 0 0  Moving slowly or fidgety/restless 0 1 0  Suicidal thoughts 0 0 0  PHQ-9 Score 0 3 0  Difficult doing work/chores Not difficult at all Not difficult at all     BP Readings from Last 3 Encounters:  02/26/22 130/80  06/17/21 (!) 142/85  04/28/21 110/80    Physical Exam Vitals and nursing note reviewed. Exam conducted with a chaperone present.  Constitutional:      General: She is not in acute distress.    Appearance: She is not diaphoretic.  HENT:     Head: Normocephalic and atraumatic.     Right Ear: Tympanic membrane and external ear normal.     Left Ear: Tympanic membrane and external ear normal.  Eyes:     General:        Right eye: No discharge.        Left eye: No discharge.     Conjunctiva/sclera: Conjunctivae normal.     Pupils: Pupils are equal, round, and reactive to light.  Neck:     Thyroid: No thyromegaly.     Vascular: No JVD.  Cardiovascular:     Rate and Rhythm: Normal rate and regular rhythm.     Heart sounds: Normal heart sounds, S1 normal and S2 normal. No murmur heard.    No systolic murmur is present.     No diastolic murmur is present.     No friction rub. No gallop. No S3 or S4 sounds.  Pulmonary:     Effort: Pulmonary effort is normal.     Breath sounds: Normal breath sounds. No wheezing, rhonchi or rales.  Abdominal:     Palpations: Abdomen is soft. There is no mass.     Tenderness: There is no abdominal tenderness.  Musculoskeletal:        General: Normal range of motion.     Cervical back: Normal range of motion and neck supple.  Lymphadenopathy:     Cervical: No cervical adenopathy.  Skin:    General: Skin is warm and dry.   Neurological:     Mental Status: She is alert.     Wt Readings from Last 3 Encounters:  02/26/22 172 lb (78 kg)  06/17/21 180 lb (81.6 kg)  04/28/21 174 lb (78.9 kg)    BP 130/80  Pulse 76   Ht '5\' 6"'$  (1.676 m)   Wt 172 lb (78 kg)   BMI 27.76 kg/m   Assessment and Plan:   1. Gastroesophageal reflux disease, unspecified whether esophagitis present Chronic.  Controlled.  Stable.  Patient doing well with no aspirate to the tracheal surface.  We will continue pantoprazole 40 mg once a day. - pantoprazole (PROTONIX) 40 MG tablet; Take 1 tablet (40 mg total) by mouth daily.  Dispense: 90 tablet; Refill: 1  2. Pure hypercholesterolemia Chronic.  Controlled.  Stable.  Continue pravastatin 20 mg once a day.  We will check lipid panel for LDL surveillance. - pravastatin (PRAVACHOL) 20 MG tablet; Take 1 tablet (20 mg total) by mouth daily.  Dispense: 90 tablet; Refill: 1 - Lipid Panel With LDL/HDL Ratio  3. Taking medication for chronic disease Patient on medications which may cause liver concerns and we will check CMP for electrolytes GFR and hepatic surveillance. - Comprehensive Metabolic Panel (CMET)

## 2022-02-27 LAB — COMPREHENSIVE METABOLIC PANEL
ALT: 16 IU/L (ref 0–32)
AST: 17 IU/L (ref 0–40)
Albumin/Globulin Ratio: 1.8 (ref 1.2–2.2)
Albumin: 4.5 g/dL (ref 3.9–4.9)
Alkaline Phosphatase: 78 IU/L (ref 44–121)
BUN/Creatinine Ratio: 17 (ref 12–28)
BUN: 15 mg/dL (ref 8–27)
Bilirubin Total: 0.7 mg/dL (ref 0.0–1.2)
CO2: 24 mmol/L (ref 20–29)
Calcium: 9.8 mg/dL (ref 8.7–10.3)
Chloride: 104 mmol/L (ref 96–106)
Creatinine, Ser: 0.9 mg/dL (ref 0.57–1.00)
Globulin, Total: 2.5 g/dL (ref 1.5–4.5)
Glucose: 91 mg/dL (ref 70–99)
Potassium: 4.2 mmol/L (ref 3.5–5.2)
Sodium: 142 mmol/L (ref 134–144)
Total Protein: 7 g/dL (ref 6.0–8.5)
eGFR: 72 mL/min/{1.73_m2} (ref >59)

## 2022-02-27 LAB — LIPID PANEL WITH LDL/HDL RATIO
Cholesterol, Total: 241 mg/dL — ABNORMAL HIGH (ref 100–199)
HDL: 57 mg/dL (ref >39)
LDL Chol Calc (NIH): 157 mg/dL — ABNORMAL HIGH (ref 0–99)
LDL/HDL Ratio: 2.8 ratio (ref 0.0–3.2)
Triglycerides: 149 mg/dL (ref 0–149)
VLDL Cholesterol Cal: 27 mg/dL (ref 5–40)

## 2022-05-19 ENCOUNTER — Ambulatory Visit (INDEPENDENT_AMBULATORY_CARE_PROVIDER_SITE_OTHER): Payer: Self-pay | Admitting: Family Medicine

## 2022-05-19 ENCOUNTER — Encounter: Payer: Self-pay | Admitting: Family Medicine

## 2022-05-19 VITALS — BP 110/78 | HR 80 | Ht 66.0 in | Wt 170.0 lb

## 2022-05-19 DIAGNOSIS — J0100 Acute maxillary sinusitis, unspecified: Secondary | ICD-10-CM

## 2022-05-19 DIAGNOSIS — R051 Acute cough: Secondary | ICD-10-CM

## 2022-05-19 MED ORDER — BENZONATATE 100 MG PO CAPS: 100.0000 mg | ORAL_CAPSULE | Freq: Three times a day (TID) | ORAL | 1 refills | Status: DC

## 2022-05-19 MED ORDER — AMOXICILLIN 500 MG PO CAPS: 500.0000 mg | ORAL_CAPSULE | Freq: Three times a day (TID) | ORAL | 0 refills | Status: AC

## 2022-05-19 NOTE — Patient Instructions (Signed)
Mediterranean Diet ?A Mediterranean diet refers to food and lifestyle choices that are based on the traditions of countries located on the [place]. It focuses on eating more fruits, vegetables, whole grains, beans, nuts, seeds, and heart-healthy fats, and eating less dairy, meat, eggs, and processed foods with added sugar, salt, and fat. This way of eating has been shown to help prevent certain conditions and improve outcomes for people who have chronic diseases, like kidney disease and heart disease. ?What are tips for following this plan? ?Reading food labels ?Check the serving size of packaged foods. For foods such as rice and pasta, the serving size refers to the amount of cooked product, not dry. ?Check the total fat in packaged foods. Avoid foods that have saturated fat or trans fats. ?Check the ingredient list for added sugars, such as corn syrup. ?Shopping ? ?Buy a variety of foods that offer a balanced diet, including: ?Fresh fruits and vegetables (produce). ?Grains, beans, nuts, and seeds. Some of these may be available in unpackaged forms or large amounts (in bulk). ?Fresh seafood. ?Poultry and eggs. ?Low-fat dairy products. ?Buy whole ingredients instead of prepackaged foods. ?Buy fresh fruits and vegetables in-season from local farmers markets. ?Buy plain frozen fruits and vegetables. ?If you do not have access to quality fresh seafood, buy precooked frozen shrimp or canned fish, such as tuna, salmon, or sardines. ?Stock your pantry so you always have certain foods on hand, such as olive oil, canned tuna, canned tomatoes, rice, pasta, and beans. ?Cooking ?Cook foods with extra-virgin olive oil instead of using butter or other vegetable oils. ?Have meat as a side dish, and have vegetables or grains as your main dish. This means having meat in small portions or adding small amounts of meat to foods like pasta or stew. ?Use beans or vegetables instead of meat in common dishes like chili or  lasagna. ?Experiment with different cooking methods. Try roasting, broiling, steaming, and saut?ing vegetables. ?Add frozen vegetables to soups, stews, pasta, or rice. ?Add nuts or seeds for added healthy fats and plant protein at each meal. You can add these to yogurt, salads, or vegetable dishes. ?Marinate fish or vegetables using olive oil, lemon juice, garlic, and fresh herbs. ?Meal planning ?Plan to eat one vegetarian meal one day each week. Try to work up to two vegetarian meals, if possible. ?Eat seafood two or more times a week. ?Have healthy snacks readily available, such as: ?Vegetable sticks with hummus. ?Greek yogurt. ?Fruit and nut trail mix. ?Eat balanced meals throughout the week. This includes: ?Fruit: 2-3 servings a day. ?Vegetables: 4-5 servings a day. ?Low-fat dairy: 2 servings a day. ?Fish, poultry, or lean meat: 1 serving a day. ?Beans and legumes: 2 or more servings a week. ?Nuts and seeds: 1-2 servings a day. ?Whole grains: 6-8 servings a day. ?Extra-virgin olive oil: 3-4 servings a day. ?Limit red meat and sweets to only a few servings a month. ?Lifestyle ? ?Cook and eat meals together with your family, when possible. ?Drink enough fluid to keep your urine pale yellow. ?Be physically active every day. This includes: ?Aerobic exercise like running or swimming. ?Leisure activities like gardening, walking, or housework. ?Get 7-8 hours of sleep each night. ?If recommended by your health care provider, drink red wine in moderation. This means 1 glass a day for nonpregnant women and 2 glasses a day for men. A glass of wine equals 5 oz (150 mL). ?What foods should I eat? ?Fruits ?Apples. Apricots. Avocado. Berries. Bananas. Cherries. Dates.   Figs. Grapes. Lemons. Melon. Oranges. Peaches. Plums. Pomegranate. ?Vegetables ?Artichokes. Beets. Broccoli. Cabbage. Carrots. Eggplant. Green beans. Chard. Kale. Spinach. Onions. Leeks. Peas. Squash. Tomatoes. Peppers. Radishes. ?Grains ?Whole-grain pasta. Brown  rice. Bulgur wheat. Polenta. Couscous. Whole-wheat bread. Oatmeal. Quinoa. ?Meats and other proteins ?Beans. Almonds. Sunflower seeds. Pine nuts. Peanuts. Cod. Salmon. Scallops. Shrimp. Tuna. Tilapia. Clams. Oysters. Eggs. Poultry without skin. ?Dairy ?Low-fat milk. Cheese. Greek yogurt. ?Fats and oils ?Extra-virgin olive oil. Avocado oil. Grapeseed oil. ?Beverages ?Water. Red wine. Herbal tea. ?Sweets and desserts ?Greek yogurt with honey. Baked apples. Poached pears. Trail mix. ?Seasonings and condiments ?Basil. Cilantro. Coriander. Cumin. Mint. Parsley. Sage. Rosemary. Tarragon. Garlic. Oregano. Thyme. Pepper. Balsamic vinegar. Tahini. Hummus. Tomato sauce. Olives. Mushrooms. ?The items listed above may not be a complete list of foods and beverages you can eat. Contact a dietitian for more information. ?What foods should I limit? ?This is a list of foods that should be eaten rarely or only on special occasions. ?Fruits ?Fruit canned in syrup. ?Vegetables ?Deep-fried potatoes (french fries). ?Grains ?Prepackaged pasta or rice dishes. Prepackaged cereal with added sugar. Prepackaged snacks with added sugar. ?Meats and other proteins ?Beef. Pork. Lamb. Poultry with skin. Hot dogs. Bacon. ?Dairy ?Ice cream. Sour cream. Whole milk. ?Fats and oils ?Butter. Canola oil. Vegetable oil. Beef fat (tallow). Lard. ?Beverages ?Juice. Sugar-sweetened soft drinks. Beer. Liquor and spirits. ?Sweets and desserts ?Cookies. Cakes. Pies. Candy. ?Seasonings and condiments ?Mayonnaise. Pre-made sauces and marinades. ?The items listed above may not be a complete list of foods and beverages you should limit. Contact a dietitian for more information. ?Summary ?The Mediterranean diet includes both food and lifestyle choices. ?Eat a variety of fresh fruits and vegetables, beans, nuts, seeds, and whole grains. ?Limit the amount of red meat and sweets that you eat. ?If recommended by your health care provider, drink red wine in moderation.  This means 1 glass a day for nonpregnant women and 2 glasses a day for men. A glass of wine equals 5 oz (150 mL). ?This information is not intended to replace advice given to you by your health care provider. Make sure you discuss any questions you have with your health care provider. ?Document Revised: 08/24/2019 Document Reviewed: 06/21/2019 ?Elsevier Patient Education ? 2023 [employer] ? ?

## 2022-05-19 NOTE — Progress Notes (Signed)
Date:  05/19/2022   Name:  Kerri Preston   DOB:  03/07/1961   MRN:  [id number]   Chief Complaint: Cough (And congestion- last couple of days have been bad, no SOB, cough gets worse at night, phlegm- thick no color)  Sinusitis This is a new problem. The current episode started in the past 7 days. The problem is unchanged. The maximum temperature recorded prior to her arrival was 100.4 - 100.9 F. Associated symptoms include chills, congestion, coughing, sinus pressure and a sore throat. Pertinent negatives include no ear pain or shortness of breath. Past treatments include oral decongestants. The treatment provided mild relief.    Lab Results  Component Value Date   NA 142 02/26/2022   K 4.2 02/26/2022   CO2 24 02/26/2022   GLUCOSE 91 02/26/2022   BUN 15 02/26/2022   CREATININE 0.90 02/26/2022   CALCIUM 9.8 02/26/2022   EGFR 72 02/26/2022   GFRNONAA 65 09/24/2020   Lab Results  Component Value Date   CHOL 241 (H) 02/26/2022   HDL 57 02/26/2022   LDLCALC 157 (H) 02/26/2022   TRIG 149 02/26/2022   CHOLHDL 5.0 (H) 01/12/2018   No results found for: "TSH" No results found for: "HGBA1C" No results found for: "WBC", "HGB", "HCT", "MCV", "PLT" Lab Results  Component Value Date   ALT 16 02/26/2022   AST 17 02/26/2022   ALKPHOS 78 02/26/2022   BILITOT 0.7 02/26/2022   No results found for: "25OHVITD2", "25OHVITD3", "VD25OH"   Review of Systems  Constitutional:  Positive for chills and fever.  HENT:  Positive for congestion, postnasal drip, rhinorrhea, sinus pressure, sinus pain and sore throat. Negative for ear pain.   Respiratory:  Positive for cough and wheezing. Negative for chest tightness, shortness of breath and stridor.   Cardiovascular:  Negative for chest pain and palpitations.    Patient Active Problem List   Diagnosis Date Noted   Hyperlipidemia, mixed 04/02/2021   Stable angina pectoris 04/02/2021   Reactive airway disease, mild intermittent, with acute  exacerbation 09/30/2016    No Known Allergies  Past Surgical History:  Procedure Laterality Date   BILATERAL TEMPOROMANDIBULAR JOINT ARTHROPLASTY     BREAST BIOPSY Left 07/28/2021   U/S bx 3:00, venus clip, path pending   COLONOSCOPY  2012   cleared for 10 yrs   DILATION AND CURETTAGE OF UTERUS     TONSILLECTOMY     WISDOM TOOTH EXTRACTION      Social History   Tobacco Use   Smoking status: Never   Smokeless tobacco: Never  Vaping Use   Vaping Use: Never used  Substance Use Topics   Alcohol use: No    Alcohol/week: 0.0 standard drinks of alcohol   Drug use: No     Medication list has been reviewed and updated.  Current Meds  Medication Sig   pantoprazole (PROTONIX) 40 MG tablet Take 1 tablet (40 mg total) by mouth daily.   pravastatin (PRAVACHOL) 20 MG tablet Take 1 tablet (20 mg total) by mouth daily.       05/19/2022    8:27 AM 02/26/2022   10:23 AM 03/24/2021    8:09 AM 07/11/2020   12:17 PM  GAD 7 : Generalized Anxiety Score  Nervous, Anxious, on Edge 0 0 0 0  Control/stop worrying 0 0 0 0  Worry too much - different things 0 0 2 0  Trouble relaxing 0 0 0 0  Restless 0 0 0 0  Easily  annoyed or irritable 0 0 0 0  Afraid - awful might happen 0 0 0 0  Total GAD 7 Score 0 0 2 0  Anxiety Difficulty Not difficult at all Not difficult at all Not difficult at all        05/19/2022    8:26 AM 02/26/2022   10:23 AM 03/24/2021    8:09 AM  Depression screen PHQ 2/9  Decreased Interest 0 0 0  Down, Depressed, Hopeless 0 0 0  PHQ - 2 Score 0 0 0  Altered sleeping 0 0 1  Tired, decreased energy 0 0 1  Change in appetite 0 0 0  Feeling bad or failure about yourself  0 0 0  Trouble concentrating 0 0 0  Moving slowly or fidgety/restless 0 0 1  Suicidal thoughts 0 0 0  PHQ-9 Score 0 0 3  Difficult doing work/chores Not difficult at all Not difficult at all Not difficult at all    BP Readings from Last 3 Encounters:  05/19/22 110/78  02/26/22 130/80   06/17/21 (!) 142/85    Physical Exam Vitals and nursing note reviewed.  HENT:     Head: Normocephalic.     Right Ear: Tympanic membrane and ear canal normal. There is no impacted cerumen.     Left Ear: Tympanic membrane and ear canal normal. There is no impacted cerumen.     Nose: No congestion or rhinorrhea.     Right Sinus: Maxillary sinus tenderness present.     Left Sinus: Maxillary sinus tenderness present.     Mouth/Throat:     Mouth: Mucous membranes are moist.     Pharynx: Oropharynx is clear. No oropharyngeal exudate or posterior oropharyngeal erythema.  Cardiovascular:     Rate and Rhythm: Normal rate.     Heart sounds: No murmur heard.    No gallop.  Pulmonary:     Breath sounds: No wheezing, rhonchi or rales.  Musculoskeletal:     Cervical back: Normal range of motion.  Neurological:     Mental Status: She is alert.     Wt Readings from Last 3 Encounters:  05/19/22 170 lb (77.1 kg)  02/26/22 172 lb (78 kg)  06/17/21 180 lb (81.6 kg)    BP 110/78   Pulse 80   Ht _0  (1.676 m)   Wt 170 lb (77.1 kg)   BMI 27.44 kg/m   Assessment and Plan:  1. Acute non-recurrent maxillary sinusitis New onset.  Persistent.  Stable.  History and exam is consistent with sinusitis tenderness over the maxillary sinuses bilateral.  We will treat with amoxicillin 500 mg 3 times a day and continue over-the-counter preparations. - amoxicillin (AMOXIL) 500 MG capsule; Take 1 capsule (500 mg total) by mouth 3 (three) times daily for 10 days.  Dispense: 30 capsule; Refill: 0  2. Acute cough Acute cough mostly from postnasal drainage we will treat with Tessalon Perles 100 mg 3 times a day as as needed. - benzonatate (TESSALON PERLES) 100 MG capsule; Take 1 capsule (100 mg total) by mouth 3 (three) times daily.  Dispense: 20 capsule; Refill: 1    Otilio Miu, MD

## 2022-07-30 IMAGING — MG MM BREAST LOCALIZATION CLIP
4 series · 4 of 12 positions shown · non-contrast
Comparison: Previous exam(s).

CLINICAL DATA: Patient is post ultrasound-guided core needle biopsy
of a 6 mm complex cystic mass over the 3 o'clock position of the
left breast.

EXAM:
3D DIAGNOSTIC LEFT MAMMOGRAM POST ULTRASOUND BIOPSY

[L CC synth-2D]
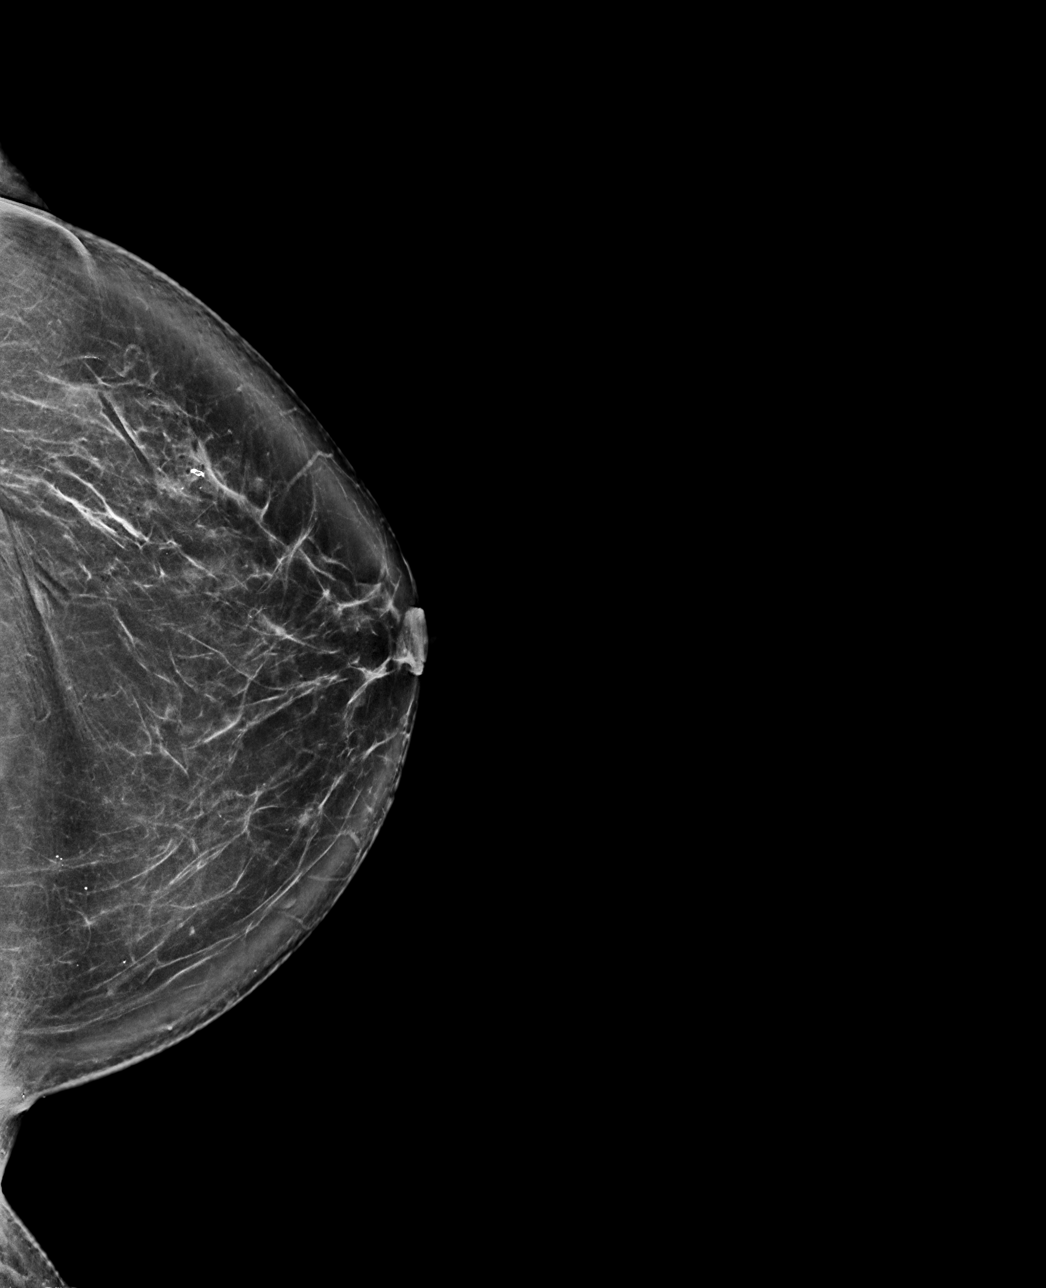

[L ML synth-2D]
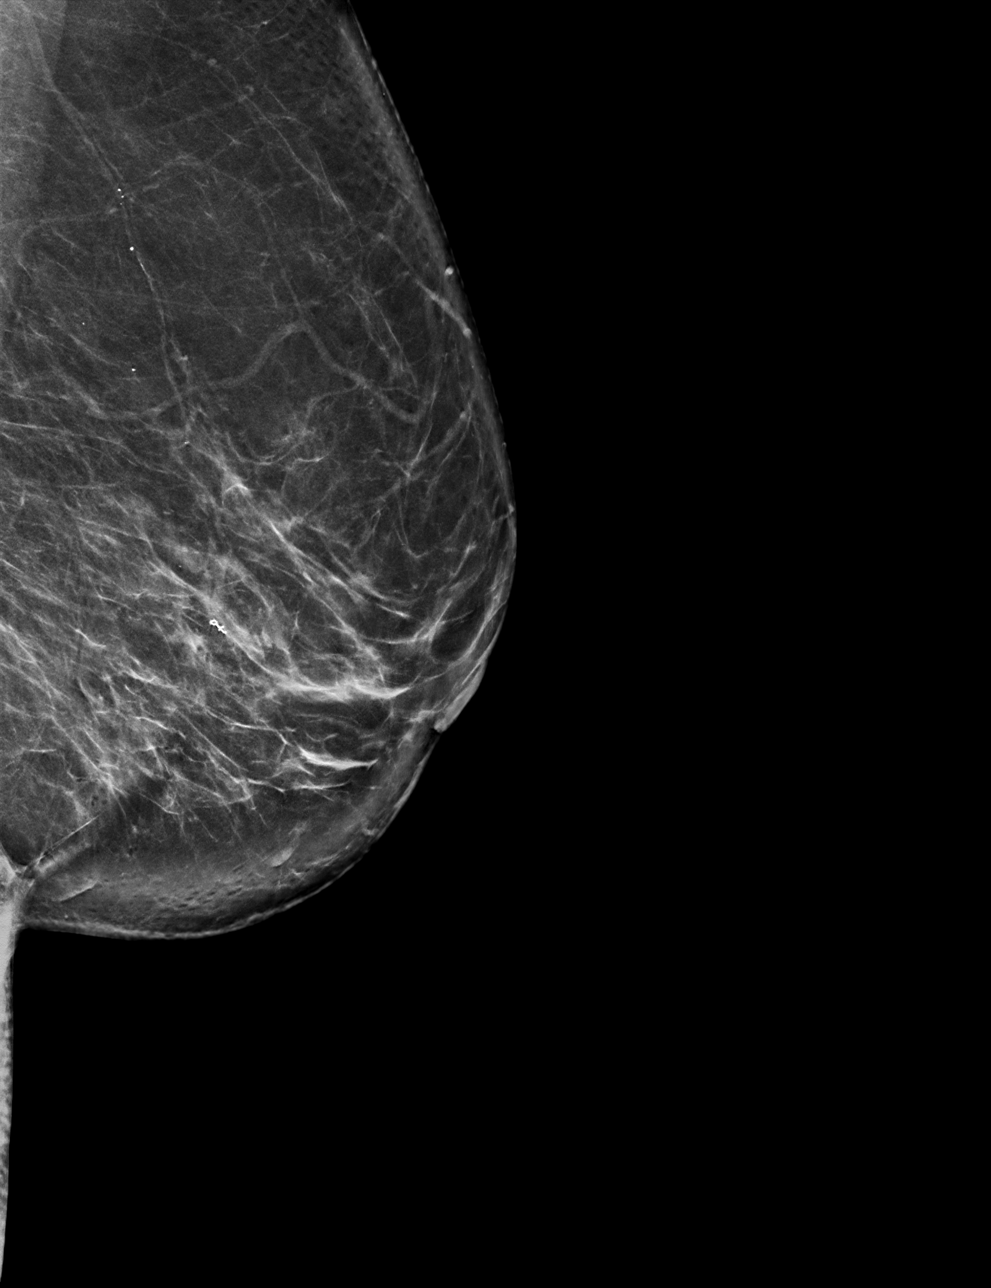

[L ML tomo · tomo slice 47/92.0]
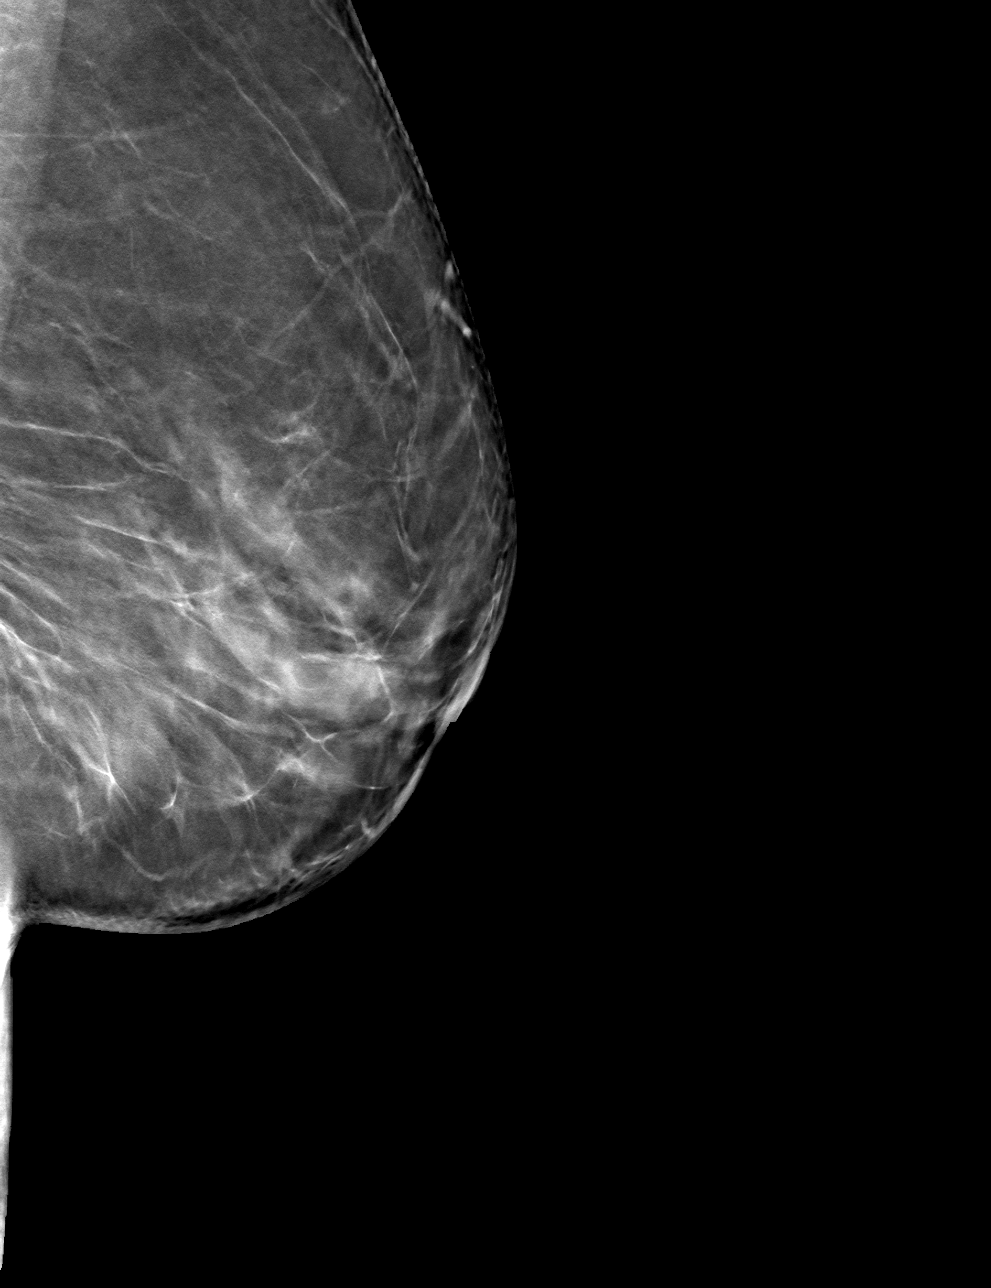

[L CC tomo · tomo slice 47/92.0]
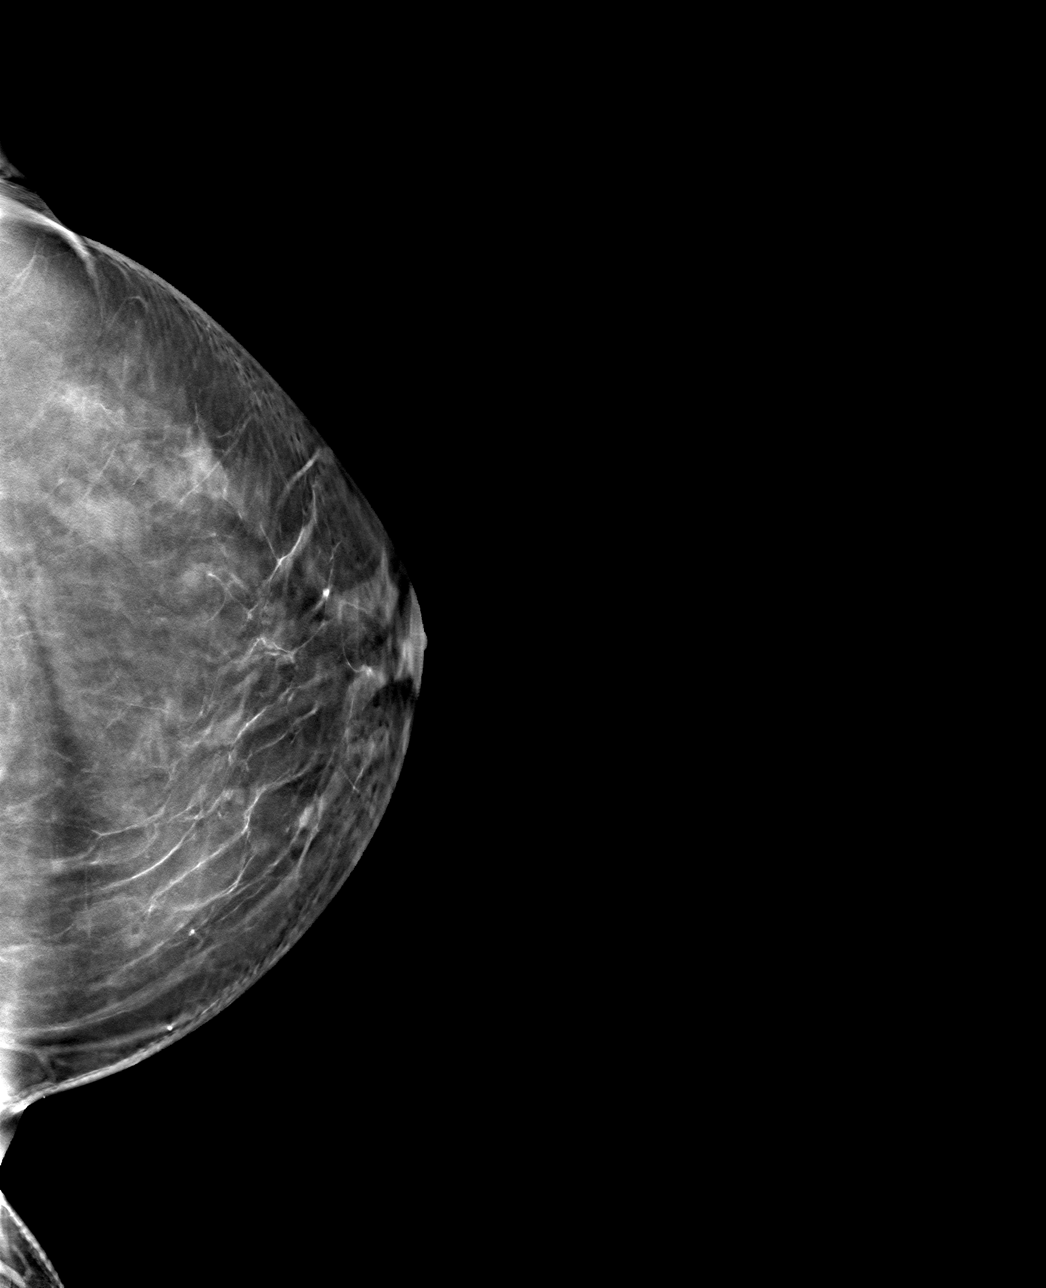

[4 of 12 positions shown; findings below may reference images not displayed]

FINDINGS: 3D Mammographic images were obtained following ultrasound guided
biopsy of the targeted mass over the 3 o'clock position. The biopsy
marking clip is in expected position at the site of biopsy.
IMPRESSION: Appropriate positioning of the venous shaped biopsy marking clip at
the site of biopsy in the outer midportion of the left breast.

Final Assessment: Post Procedure Mammograms for Marker Placement

## 2022-08-30 ENCOUNTER — Telehealth: Payer: Self-pay

## 2022-08-30 ENCOUNTER — Encounter: Payer: Self-pay | Admitting: Family Medicine

## 2022-08-30 ENCOUNTER — Other Ambulatory Visit: Payer: Self-pay

## 2022-08-30 ENCOUNTER — Ambulatory Visit (INDEPENDENT_AMBULATORY_CARE_PROVIDER_SITE_OTHER): Payer: Self-pay | Admitting: Family Medicine

## 2022-08-30 VITALS — BP 120/76 | HR 87 | Ht 66.0 in | Wt 174.0 lb

## 2022-08-30 DIAGNOSIS — E7800 Pure hypercholesterolemia, unspecified: Secondary | ICD-10-CM

## 2022-08-30 DIAGNOSIS — Z1211 Encounter for screening for malignant neoplasm of colon: Secondary | ICD-10-CM

## 2022-08-30 DIAGNOSIS — K219 Gastro-esophageal reflux disease without esophagitis: Secondary | ICD-10-CM

## 2022-08-30 MED ORDER — NA SULFATE-K SULFATE-MG SULF 17.5-3.13-1.6 GM/177ML PO SOLN: 1.0000 | Freq: Once | ORAL | 0 refills | Status: AC

## 2022-08-30 MED ORDER — PANTOPRAZOLE SODIUM 40 MG PO TBEC: 40.0000 mg | DELAYED_RELEASE_TABLET | Freq: Every day | ORAL | 1 refills | Status: DC

## 2022-08-30 MED ORDER — PRAVASTATIN SODIUM 20 MG PO TABS: 20.0000 mg | ORAL_TABLET | Freq: Every day | ORAL | 1 refills | Status: DC

## 2022-08-30 NOTE — Progress Notes (Signed)
Date:  08/30/2022   Name:  Kerri Preston   DOB:  [birth year]   MRN:  [id number]   Chief Complaint: Gastroesophageal Reflux and Hyperlipidemia  Gastroesophageal Reflux She reports no abdominal pain, no choking, no coughing, no dysphagia, no nausea, no sore throat or no wheezing. This is a chronic problem. The current episode started 1 to 4 weeks ago. The problem occurs occasionally. The problem has been gradually improving. The symptoms are aggravated by certain foods. Pertinent negatives include no anemia, fatigue, melena, muscle weakness, orthopnea or weight loss. She has tried a PPI for the symptoms. The treatment provided moderate relief.  Hyperlipidemia This is a chronic problem. The current episode started more than 1 year ago. The problem is controlled. Recent lipid tests were reviewed and are normal. She has no history of chronic renal disease, diabetes, hypothyroidism, liver disease, obesity or nephrotic syndrome. Pertinent negatives include no myalgias or shortness of breath. Current antihyperlipidemic treatment includes statins. The current treatment provides moderate improvement of lipids. There are no compliance problems.     Lab Results  Component Value Date   NA 142 02/26/2022   K 4.2 02/26/2022   CO2 24 02/26/2022   GLUCOSE 91 02/26/2022   BUN 15 02/26/2022   CREATININE 0.90 02/26/2022   CALCIUM 9.8 02/26/2022   EGFR 72 02/26/2022   GFRNONAA 65 09/24/2020   Lab Results  Component Value Date   CHOL 241 (H) 02/26/2022   HDL 57 02/26/2022   LDLCALC 157 (H) 02/26/2022   TRIG 149 02/26/2022   CHOLHDL 5.0 (H) 01/12/2018   No results found for: "TSH" No results found for: "HGBA1C" No results found for: "WBC", "HGB", "HCT", "MCV", "PLT" Lab Results  Component Value Date   ALT 16 02/26/2022   AST 17 02/26/2022   ALKPHOS 78 02/26/2022   BILITOT 0.7 02/26/2022   No results found for: "25OHVITD2", "25OHVITD3", "VD25OH"   Review of Systems  Constitutional: Negative.   Negative for chills, fatigue, fever, unexpected weight change and weight loss.  HENT:  Negative for congestion, ear discharge, ear pain, rhinorrhea, sinus pressure, sneezing and sore throat.   Respiratory:  Negative for cough, choking, shortness of breath, wheezing and stridor.   Gastrointestinal:  Negative for abdominal pain, blood in stool, constipation, diarrhea, dysphagia, melena and nausea.  Genitourinary:  Negative for dysuria, flank pain, frequency, hematuria, urgency and vaginal discharge.  Musculoskeletal:  Negative for arthralgias, back pain, myalgias and muscle weakness.  Skin:  Negative for rash.  Neurological:  Negative for dizziness, weakness and headaches.  Hematological:  Negative for adenopathy. Does not bruise/bleed easily.  Psychiatric/Behavioral:  Negative for dysphoric mood. The patient is not nervous/anxious.     Patient Active Problem List   Diagnosis Date Noted   Hyperlipidemia, mixed 04/02/2021   Stable angina pectoris 04/02/2021   Reactive airway disease, mild intermittent, with acute exacerbation 09/30/2016    No Known Allergies  Past Surgical History:  Procedure Laterality Date   BILATERAL TEMPOROMANDIBULAR JOINT ARTHROPLASTY     BREAST BIOPSY Left 07/28/2021   U/S bx 3:00, venus clip, path pending   COLONOSCOPY  2012   cleared for 10 yrs   DILATION AND CURETTAGE OF UTERUS     TONSILLECTOMY     WISDOM TOOTH EXTRACTION      Social History   Tobacco Use   Smoking status: Never   Smokeless tobacco: Never  Vaping Use   Vaping Use: Never used  Substance Use Topics   Alcohol use: No  Alcohol/week: 0.0 standard drinks of alcohol   Drug use: No     Medication list has been reviewed and updated.  Current Meds  Medication Sig   pantoprazole (PROTONIX) 40 MG tablet Take 1 tablet (40 mg total) by mouth daily.   pravastatin (PRAVACHOL) 20 MG tablet Take 1 tablet (20 mg total) by mouth daily.       08/30/2022    9:39 AM 05/19/2022    8:27 AM  02/26/2022   10:23 AM 03/24/2021    8:09 AM  GAD 7 : Generalized Anxiety Score  Nervous, Anxious, on Edge 0 0 0 0  Control/stop worrying 0 0 0 0  Worry too much - different things 0 0 0 2  Trouble relaxing 0 0 0 0  Restless 0 0 0 0  Easily annoyed or irritable 0 0 0 0  Afraid - awful might happen 0 0 0 0  Total GAD 7 Score 0 0 0 2  Anxiety Difficulty Not difficult at all Not difficult at all Not difficult at all Not difficult at all       08/30/2022    9:39 AM 05/19/2022    8:26 AM 02/26/2022   10:23 AM  Depression screen PHQ 2/9  Decreased Interest 0 0 0  Down, Depressed, Hopeless 0 0 0  PHQ - 2 Score 0 0 0  Altered sleeping 0 0 0  Tired, decreased energy 0 0 0  Change in appetite 0 0 0  Feeling bad or failure about yourself  0 0 0  Trouble concentrating 0 0 0  Moving slowly or fidgety/restless 0 0 0  Suicidal thoughts 0 0 0  PHQ-9 Score 0 0 0  Difficult doing work/chores Not difficult at all Not difficult at all Not difficult at all    BP Readings from Last 3 Encounters:  08/30/22 120/76  05/19/22 110/78  02/26/22 130/80    Physical Exam Vitals and nursing note reviewed. Exam conducted with a chaperone present.  Constitutional:      General: She is not in acute distress.    Appearance: She is not diaphoretic.  HENT:     Head: Normocephalic and atraumatic.     Right Ear: Tympanic membrane and external ear normal.     Left Ear: Tympanic membrane and external ear normal.     Nose: Nose normal.  Eyes:     General:        Right eye: No discharge.        Left eye: No discharge.     Conjunctiva/sclera: Conjunctivae normal.     Pupils: Pupils are equal, round, and reactive to light.  Neck:     Thyroid: No thyromegaly.     Vascular: No JVD.  Cardiovascular:     Rate and Rhythm: Normal rate and regular rhythm.     Heart sounds: Normal heart sounds, S1 normal and S2 normal. No murmur heard.    No systolic murmur is present.     No diastolic murmur is present.      No friction rub. No gallop. No S3 or S4 sounds.  Pulmonary:     Effort: Pulmonary effort is normal.     Breath sounds: Normal breath sounds.  Abdominal:     General: Bowel sounds are normal.     Palpations: Abdomen is soft. There is no mass.     Tenderness: There is no abdominal tenderness. There is no guarding.  Musculoskeletal:        General: Normal range  of motion.     Cervical back: Normal range of motion and neck supple.  Lymphadenopathy:     Cervical: No cervical adenopathy.  Skin:    General: Skin is warm and dry.  Neurological:     Mental Status: She is alert.     Deep Tendon Reflexes: Reflexes are normal and symmetric.     Wt Readings from Last 3 Encounters:  08/30/22 174 lb (78.9 kg)  05/19/22 170 lb (77.1 kg)  02/26/22 172 lb (78 kg)    BP 120/76   Pulse 87   Ht '5\' 6"'$  (1.676 m)   Wt 174 lb (78.9 kg)   SpO2 95%   BMI 28.08 kg/m   Assessment and Plan:  1. Gastroesophageal reflux disease, unspecified whether esophagitis present Chronic.  Controlled.  Stable.  Continue pantoprazole 40 mg once a day.  Will recheck in 6 months. - pantoprazole (PROTONIX) 40 MG tablet; Take 1 tablet (40 mg total) by mouth daily.  Dispense: 90 tablet; Refill: 1  2. Pure hypercholesterolemia Chronic.  Uncontrolled.  Stable.  Last visit abnormal LDL patient is currently now on pravastatin 20 mg once a day.  Will check lipid panel for level of control. - pravastatin (PRAVACHOL) 20 MG tablet; Take 1 tablet (20 mg total) by mouth daily.  Dispense: 90 tablet; Refill: 1 - Lipid Panel With LDL/HDL Ratio  3. Colon cancer screening Discussed with patient and referral placed with gastroenterology for colon cancer screening with colonoscopy. - Ambulatory referral to Gastroenterology    Otilio Miu, MD

## 2022-08-30 NOTE — Telephone Encounter (Signed)
Gastroenterology Pre-Procedure Review  Request Date: 09/06/22 Requesting Physician: Dr. Allen Norris  PATIENT REVIEW QUESTIONS: The patient responded to the following health history questions as indicated:    1. Are you having any GI issues? no 2. Do you have a personal history of Polyps? no 3. Do you have a family history of Colon Cancer or Polyps? no 4. Diabetes Mellitus? no 5. Joint replacements in the past 12 months?no 6. Major health problems in the past 3 months?no 7. Any artificial heart valves, MVP, or defibrillator?no    MEDICATIONS & ALLERGIES:    Patient reports the following regarding taking any anticoagulation/antiplatelet therapy:   Plavix, Coumadin, Eliquis, Xarelto, Lovenox, Pradaxa, Brilinta, or Effient? no Aspirin? no  Patient confirms/reports the following medications:  Current Outpatient Medications  Medication Sig Dispense Refill   pantoprazole (PROTONIX) 40 MG tablet Take 1 tablet (40 mg total) by mouth daily. 90 tablet 1   pravastatin (PRAVACHOL) 20 MG tablet Take 1 tablet (20 mg total) by mouth daily. 90 tablet 1   No current facility-administered medications for this visit.    Patient confirms/reports the following allergies:  No Known Allergies  No orders of the defined types were placed in this encounter.   AUTHORIZATION INFORMATION Primary Insurance: 1D#: Group #:  Secondary Insurance: 1D#: Group #:  SCHEDULE INFORMATION: Date: 09/06/22 Time: Location: [place]

## 2022-08-31 ENCOUNTER — Encounter: Payer: Self-pay | Admitting: Gastroenterology

## 2022-08-31 LAB — LIPID PANEL WITH LDL/HDL RATIO
Cholesterol, Total: 212 mg/dL — ABNORMAL HIGH (ref 100–199)
HDL: 64 mg/dL (ref >39)
LDL Chol Calc (NIH): 135 mg/dL — ABNORMAL HIGH (ref 0–99)
LDL/HDL Ratio: 2.1 ratio (ref 0.0–3.2)
Triglycerides: 74 mg/dL (ref 0–149)
VLDL Cholesterol Cal: 13 mg/dL (ref 5–40)

## 2022-09-02 ENCOUNTER — Telehealth: Payer: Self-pay | Admitting: Gastroenterology

## 2022-09-02 MED ORDER — GOLYTELY 236 G PO SOLR: 4000.0000 mL | Freq: Once | ORAL | 0 refills | Status: AC

## 2022-09-02 NOTE — Telephone Encounter (Signed)
Patient's call has been returned.  Her prep has been changed from Suprep to Golytely.  Advised to drink 8 oz every 15-20 mins until half has been completed.  5 hours prior to colonoscopy resume drinking the remainder half.  Drinking 8 oz every 15-20 mins until entire contents have been completed.  Finishing the entire contents within 2 hours prior to colonoscopy.  Thanks,  [place], [place]

## 2022-09-02 NOTE — Telephone Encounter (Signed)
Patient calling stating that her bowel prep is too expensive and she is wondering if there is an alternative. Procedure is scheduled for 09/06/2022.

## 2022-09-02 NOTE — Addendum Note (Signed)
Addended by: Vanetta Mulders on: 09/02/2022 11:00 AM   Modules accepted: Orders

## 2022-09-03 ENCOUNTER — Encounter: Payer: Self-pay | Admitting: Family Medicine

## 2022-09-06 ENCOUNTER — Ambulatory Visit: Payer: Self-pay | Admitting: Anesthesiology

## 2022-09-06 ENCOUNTER — Ambulatory Visit
Admission: RE | Admit: 2022-09-06 | Discharge: 2022-09-06 | Disposition: A | Discharge status: Home / Self Care | Payer: Self-pay | Attending: Gastroenterology | Admitting: Gastroenterology

## 2022-09-06 ENCOUNTER — Encounter: Payer: Self-pay | Admitting: Gastroenterology

## 2022-09-06 ENCOUNTER — Encounter
Admission: RE | Disposition: A | Discharge status: Home / Self Care | Payer: Self-pay | Source: Home / Self Care | Attending: Gastroenterology

## 2022-09-06 ENCOUNTER — Other Ambulatory Visit: Payer: Self-pay

## 2022-09-06 DIAGNOSIS — D122 Benign neoplasm of ascending colon: Secondary | ICD-10-CM | POA: Insufficient documentation

## 2022-09-06 DIAGNOSIS — Z1211 Encounter for screening for malignant neoplasm of colon: Secondary | ICD-10-CM

## 2022-09-06 DIAGNOSIS — G4730 Sleep apnea, unspecified: Secondary | ICD-10-CM | POA: Insufficient documentation

## 2022-09-06 DIAGNOSIS — K640 First degree hemorrhoids: Secondary | ICD-10-CM | POA: Insufficient documentation

## 2022-09-06 DIAGNOSIS — K635 Polyp of colon: Secondary | ICD-10-CM

## 2022-09-06 DIAGNOSIS — K219 Gastro-esophageal reflux disease without esophagitis: Secondary | ICD-10-CM | POA: Insufficient documentation

## 2022-09-06 DIAGNOSIS — E785 Hyperlipidemia, unspecified: Secondary | ICD-10-CM | POA: Insufficient documentation

## 2022-09-06 HISTORY — DX: Unspecified asthma, uncomplicated: J45.909

## 2022-09-06 HISTORY — DX: Nonrheumatic mitral (valve) prolapse: I34.1

## 2022-09-06 HISTORY — DX: Migraine, unspecified, not intractable, without status migrainosus: G43.909

## 2022-09-06 HISTORY — DX: Sleep apnea, unspecified: G47.30

## 2022-09-06 HISTORY — DX: Family history of other specified conditions: Z84.89

## 2022-09-06 HISTORY — PX: POLYPECTOMY: SHX5525

## 2022-09-06 HISTORY — DX: Gastro-esophageal reflux disease without esophagitis: K21.9

## 2022-09-06 HISTORY — PX: COLONOSCOPY WITH PROPOFOL: SHX5780

## 2022-09-06 HISTORY — DX: Unspecified osteoarthritis, unspecified site: M19.90

## 2022-09-06 HISTORY — DX: Dizziness and giddiness: R42

## 2022-09-06 SURGERY — COLONOSCOPY WITH PROPOFOL
Anesthesia: General | Site: Rectum

## 2022-09-06 MED ORDER — LIDOCAINE HCL (CARDIAC) PF 100 MG/5ML IV SOSY
PREFILLED_SYRINGE | INTRAVENOUS | Status: DC | PRN
  Administered 2022-09-06: 60 mg via INTRAVENOUS

## 2022-09-06 MED ORDER — SODIUM CHLORIDE 0.9 % IV SOLN: INTRAVENOUS | Status: DC

## 2022-09-06 MED ORDER — STERILE WATER FOR IRRIGATION IR SOLN
Status: DC | PRN
  Administered 2022-09-06: 1

## 2022-09-06 MED ORDER — LACTATED RINGERS IV SOLN: INTRAVENOUS | Status: DC

## 2022-09-06 MED ORDER — STERILE WATER FOR IRRIGATION IR SOLN
Status: DC | PRN
  Administered 2022-09-06: 120 mL

## 2022-09-06 MED ORDER — PROPOFOL 10 MG/ML IV BOLUS
INTRAVENOUS | Status: DC | PRN
  Administered 2022-09-06: 70 mg via INTRAVENOUS
  Administered 2022-09-06: 50 mg via INTRAVENOUS

## 2022-09-06 SURGICAL SUPPLY — 21 items

## 2022-09-06 NOTE — Anesthesia Postprocedure Evaluation (Signed)
Anesthesia Post Note  Patient: Kerri Preston  Procedure(s) Performed: COLONOSCOPY WITH PROPOFOL (Rectum) POLYPECTOMY (Rectum)  Patient location during evaluation: PACU Anesthesia Type: General Level of consciousness: awake and alert Pain management: pain level controlled Vital Signs Assessment: post-procedure vital signs reviewed and stable Respiratory status: spontaneous breathing, nonlabored ventilation, respiratory function stable and patient connected to nasal cannula oxygen Cardiovascular status: blood pressure returned to baseline and stable Postop Assessment: no apparent nausea or vomiting Anesthetic complications: no   No notable events documented.   Last Vitals:  Vitals:   09/06/22 1100 09/06/22 1106  BP: 106/71 117/74  Pulse: 64 (!) 59  Resp: 20 14  Temp:  (!) 36.1 C  SpO2: 98% 97%    Last Pain:  Vitals:   09/06/22 1106  TempSrc:   PainSc: 0-No pain                 Martha Clan

## 2022-09-06 NOTE — Anesthesia Preprocedure Evaluation (Signed)
Anesthesia Evaluation  Patient identified by MRN, date of birth, ID band Patient awake    Reviewed: Allergy & Precautions, H&P , NPO status , Patient's Chart, lab work & pertinent test results, reviewed documented beta blocker date and time   History of Anesthesia Complications Negative for: history of anesthetic complications  Airway Mallampati: III  TM Distance: >3 FB Neck ROM: full    Dental  (+) Dental Advidsory Given, Missing, Teeth Intact   Pulmonary neg shortness of breath, asthma (as a child) , sleep apnea , neg COPD, neg recent URI   Pulmonary exam normal breath sounds clear to auscultation       Cardiovascular Exercise Tolerance: Good negative cardio ROS Normal cardiovascular exam Rhythm:regular Rate:Normal     Neuro/Psych negative neurological ROS  negative psych ROS   GI/Hepatic Neg liver ROS,GERD  ,,  Endo/Other  negative endocrine ROS    Renal/GU negative Renal ROS  negative genitourinary   Musculoskeletal   Abdominal   Peds  Hematology negative hematology ROS (+)   Anesthesia Other Findings Past Medical History: No date: Arthritis     Comment:  hands No date: Childhood asthma No date: Family history of adverse reaction to anesthesia     Comment:  Mother - slow to wake No date: GERD (gastroesophageal reflux disease) No date: Hyperlipidemia No date: Migraine headache     Comment:  approx 6x/yr.  Stress headaches more often No date: Mitral valve prolapse     Comment:  DX'd > 30 yrs ago.  No issues No date: Sleep apnea     Comment:  No CPAP No date: Vertigo   Reproductive/Obstetrics negative OB ROS                             Anesthesia Physical Anesthesia Plan  ASA: 2  Anesthesia Plan: General   Post-op Pain Management:    Induction: Intravenous  PONV Risk Score and Plan: 3 and Propofol infusion and TIVA  Airway Management Planned: Natural Airway and Nasal  Cannula  Additional Equipment:   Intra-op Plan:   Post-operative Plan:   Informed Consent: I have reviewed the patients History and Physical, chart, labs and discussed the procedure including the risks, benefits and alternatives for the proposed anesthesia with the patient or authorized representative who has indicated his/her understanding and acceptance.     Dental Advisory Given  Plan Discussed with: Anesthesiologist, CRNA and Surgeon  Anesthesia Plan Comments:        Anesthesia Quick Evaluation

## 2022-09-06 NOTE — Op Note (Signed)
[facility] Gastroenterology Patient Name: Kerri Preston Procedure Date: 09/06/2022 10:25 AM MRN: [id number] Account #: [id number] Date of Birth: Oct 30, 1960 Admit Type: Outpatient Age: 62 Room: [facility] OR ROOM 01 Gender: Female Note Status: Finalized Instrument Name: [id number] Procedure:             Colonoscopy Indications:           Screening for colorectal malignant neoplasm Providers:             Lucilla Lame MD, MD Referring MD:          Lucilla Lame MD, MD (Referring MD), Juline Patch, MD                         (Referring MD) Medicines:             Propofol per Anesthesia Complications:         No immediate complications. Procedure:             Pre-Anesthesia Assessment:                        - Prior to the procedure, a History and Physical was                         performed, and patient medications and allergies were                         reviewed. The patient's tolerance of previous                         anesthesia was also reviewed. The risks and benefits                         of the procedure and the sedation options and risks                         were discussed with the patient. All questions were                         answered, and informed consent was obtained. Prior                         Anticoagulants: The patient has taken no anticoagulant                         or antiplatelet agents. ASA Grade Assessment: II - A                         patient with mild systemic disease. After reviewing                         the risks and benefits, the patient was deemed in                         satisfactory condition to undergo the procedure.                        After obtaining informed consent, the colonoscope was  passed under direct vision. Throughout the procedure,                         the patient's blood pressure, pulse, and oxygen                         saturations were monitored continuously. The was                          introduced through the anus and advanced to the the                         cecum, identified by appendiceal orifice and ileocecal                         valve. The colonoscopy was performed without                         difficulty. The patient tolerated the procedure well.                         The quality of the bowel preparation was excellent. Findings:      The perianal and digital rectal examinations were normal.      A 3 mm polyp was found in the ascending colon. The polyp was sessile.       The polyp was removed with a cold biopsy forceps. Resection and       retrieval were complete.      Non-bleeding internal hemorrhoids were found during retroflexion. The       hemorrhoids were Grade I (internal hemorrhoids that do not prolapse). Impression:            - One 3 mm polyp in the ascending colon, removed with                         a cold biopsy forceps. Resected and retrieved.                        - Non-bleeding internal hemorrhoids. Recommendation:        - Discharge patient to home.                        - Resume previous diet.                        - Continue present medications.                        - Await pathology results.                        - If the pathology report reveals adenomatous tissue,                         then repeat the colonoscopy in 7 years. Procedure Code(s):     --- Professional ---                        [phone], Colonoscopy, flexible; with biopsy, single or  multiple Diagnosis Code(s):     --- Professional ---                        Z12.11, Encounter for screening for malignant neoplasm                         of colon                        D12.2, Benign neoplasm of ascending colon CPT copyright 2022 American Medical Association. All rights reserved. The codes documented in this report are preliminary and upon coder review may  be revised to meet current compliance requirements. Lucilla Lame MD,  MD 09/06/2022 10:53:56 AM This report has been signed electronically. Number of Addenda: 0 Note Initiated On: 09/06/2022 10:25 AM Scope Withdrawal Time: 0 hours 9 minutes 22 seconds  Total Procedure Duration: 0 hours 12 minutes 24 seconds  Estimated Blood Loss:  Estimated blood loss: none.      [facility]

## 2022-09-06 NOTE — H&P (Signed)
Lucilla Lame, MD [facility] [address], [place], [place] [address] Phone: [phone] Fax : [phone]  Primary Care Physician:  Juline Patch, MD Primary Gastroenterologist:  Dr. Allen Norris  Pre-Procedure History & Physical: HPI:  Kerri Preston is a 62 y.o. female is here for a screening colonoscopy.   Past Medical History:  Diagnosis Date   Arthritis    hands   Childhood asthma    Family history of adverse reaction to anesthesia    Mother - slow to wake   GERD (gastroesophageal reflux disease)    Hyperlipidemia    Migraine headache    approx 6x/yr.  Stress headaches more often   Mitral valve prolapse    DX'd > 30 yrs ago.  No issues   Sleep apnea    No CPAP   Vertigo     Past Surgical History:  Procedure Laterality Date   BILATERAL TEMPOROMANDIBULAR JOINT ARTHROPLASTY     BREAST BIOPSY Left 07/28/2021   U/S bx 3:00, venus clip, path pending   COLONOSCOPY  2012   cleared for 10 yrs   [place] EXTRACTION      Prior to Admission medications   Medication Sig Start Date End Date Taking? Authorizing Provider  pantoprazole (PROTONIX) 40 MG tablet Take 1 tablet (40 mg total) by mouth daily. 08/30/22  Yes Juline Patch, MD  pravastatin (PRAVACHOL) 20 MG tablet Take 1 tablet (20 mg total) by mouth daily. 08/30/22  Yes Juline Patch, MD    Allergies as of 08/30/2022   (No Known Allergies)    Family History  Problem Relation Age of Onset   Hypertension Father    Breast cancer Paternal Aunt        32's   Osteoarthritis Mother    Osteoporosis Mother     Social History   Socioeconomic History   Marital status: Married    Spouse name: Not on file   Number of children: Not on file   Years of education: Not on file   Highest education level: Not on file  Occupational History   Not on file  Tobacco Use   Smoking status: Never   Smokeless tobacco: Never  Vaping Use   Vaping Use: Never used   Substance and Sexual Activity   Alcohol use: No    Alcohol/week: 0.0 standard drinks of alcohol   Drug use: No   Sexual activity: Yes    Birth control/protection: Post-menopausal  Other Topics Concern   Not on file  Social History Narrative   Not on file   Social Determinants of Health   Financial Resource Strain: Not on file  Food Insecurity: Not on file  Transportation Needs: Not on file  Physical Activity: Sufficiently Active (01/03/2018)   Exercise Vital Sign    Days of Exercise per Week: 6 days    Minutes of Exercise per Session: 50 min  Stress: Not on file  Social Connections: Not on file  Intimate Partner Violence: Not on file    Review of Systems: See HPI, otherwise negative ROS  Physical Exam: BP 131/84   Pulse 70   Temp 97.6 F (36.4 C) (Temporal)   Ht '5\' 6"'$  (1.676 m)   Wt 77.1 kg   SpO2 96%   BMI 27.44 kg/m  General:   Alert,  pleasant and cooperative in NAD Head:  Normocephalic and atraumatic. Neck:  Supple; no masses or thyromegaly.  Lungs:  Clear throughout to auscultation.    Heart:  Regular rate and rhythm. Abdomen:  Soft, nontender and nondistended. Normal bowel sounds, without guarding, and without rebound.   Neurologic:  Alert and  oriented x4;  grossly normal neurologically.  Impression/Plan: Kerri Preston is now here to undergo a screening colonoscopy.  Risks, benefits, and alternatives regarding colonoscopy have been reviewed with the patient.  Questions have been answered.  All parties agreeable.

## 2022-09-06 NOTE — Transfer of Care (Signed)
Immediate Anesthesia Transfer of Care Note  Patient: Kerri Preston  Procedure(s) Performed: COLONOSCOPY WITH PROPOFOL (Rectum) POLYPECTOMY (Rectum)  Patient Location: PACU  Anesthesia Type: General  Level of Consciousness: awake, alert  and patient cooperative  Airway and Oxygen Therapy: Patient Spontanous Breathing and Patient connected to supplemental oxygen  Post-op Assessment: Post-op Vital signs reviewed, Patient's Cardiovascular Status Stable, Respiratory Function Stable, Patent Airway and No signs of Nausea or vomiting  Post-op Vital Signs: Reviewed and stable  Complications: No notable events documented.

## 2022-09-07 ENCOUNTER — Encounter: Payer: Self-pay | Admitting: Gastroenterology

## 2022-09-07 LAB — SURGICAL PATHOLOGY

## 2022-09-08 ENCOUNTER — Encounter: Payer: Self-pay | Admitting: Gastroenterology

## 2022-09-08 ENCOUNTER — Other Ambulatory Visit: Payer: Self-pay

## 2022-09-08 DIAGNOSIS — Z1231 Encounter for screening mammogram for malignant neoplasm of breast: Secondary | ICD-10-CM

## 2022-10-05 ENCOUNTER — Ambulatory Visit: Payer: Self-pay

## 2022-10-11 ENCOUNTER — Ambulatory Visit: Payer: Self-pay

## 2022-10-18 ENCOUNTER — Encounter: Payer: Self-pay | Admitting: Family Medicine

## 2022-10-18 ENCOUNTER — Ambulatory Visit (INDEPENDENT_AMBULATORY_CARE_PROVIDER_SITE_OTHER): Payer: Self-pay | Admitting: Family Medicine

## 2022-10-18 VITALS — BP 120/76 | HR 82 | Ht 66.0 in | Wt 174.0 lb

## 2022-10-18 DIAGNOSIS — J0100 Acute maxillary sinusitis, unspecified: Secondary | ICD-10-CM

## 2022-10-18 MED ORDER — AZITHROMYCIN 250 MG PO TABS: ORAL_TABLET | ORAL | 1 refills | Status: AC

## 2022-10-18 NOTE — Progress Notes (Signed)
Date:  10/18/2022   Name:  Kerri Preston   DOB:  [birth year]   MRN:  [id number]   Chief Complaint: Sore Throat (Cong, coughing at night from drainage- little yellow production x 1 week)  Sinusitis This is a new problem. The current episode started in the past 7 days. The problem has been waxing and waning since onset. Maximum temperature: low grade. The pain is mild. Associated symptoms include chills, congestion, coughing, diaphoresis, ear pain, headaches, a hoarse voice, shortness of breath, sinus pressure, sneezing, a sore throat and swollen glands. Pertinent negatives include no neck pain. Past treatments include oral decongestants. The treatment provided mild relief.    Lab Results  Component Value Date   NA 142 02/26/2022   K 4.2 02/26/2022   CO2 24 02/26/2022   GLUCOSE 91 02/26/2022   BUN 15 02/26/2022   CREATININE 0.90 02/26/2022   CALCIUM 9.8 02/26/2022   EGFR 72 02/26/2022   GFRNONAA 65 09/24/2020   Lab Results  Component Value Date   CHOL 212 (H) 08/30/2022   HDL 64 08/30/2022   LDLCALC 135 (H) 08/30/2022   TRIG 74 08/30/2022   CHOLHDL 5.0 (H) 01/12/2018   No results found for: "TSH" No results found for: "HGBA1C" No results found for: "WBC", "HGB", "HCT", "MCV", "PLT" Lab Results  Component Value Date   ALT 16 02/26/2022   AST 17 02/26/2022   ALKPHOS 78 02/26/2022   BILITOT 0.7 02/26/2022   No results found for: "25OHVITD2", "25OHVITD3", "VD25OH"   Review of Systems  Constitutional:  Positive for chills, diaphoresis and fever.  HENT:  Positive for congestion, ear pain, hoarse voice, postnasal drip, rhinorrhea, sinus pressure, sinus pain, sneezing and sore throat. Negative for hearing loss and trouble swallowing.   Eyes:  Negative for visual disturbance.  Respiratory:  Positive for cough and shortness of breath. Negative for chest tightness and wheezing.   Cardiovascular:  Negative for chest pain, palpitations and leg swelling.  Gastrointestinal:   Negative for abdominal pain and blood in stool.  Endocrine: Negative for polydipsia and polyuria.  Genitourinary:  Negative for difficulty urinating and menstrual problem.  Musculoskeletal:  Negative for neck pain.  Neurological:  Positive for headaches.    Patient Active Problem List   Diagnosis Date Noted   Encounter for screening colonoscopy 09/06/2022   Polyp of ascending colon 09/06/2022   Hyperlipidemia, mixed 04/02/2021   Stable angina pectoris 04/02/2021   Reactive airway disease, mild intermittent, with acute exacerbation 09/30/2016    No Known Allergies  Past Surgical History:  Procedure Laterality Date   BILATERAL TEMPOROMANDIBULAR JOINT ARTHROPLASTY     BREAST BIOPSY Left 07/28/2021   U/S bx 3:00, venus clip, path pending   COLONOSCOPY  2012   cleared for 10 yrs   COLONOSCOPY WITH PROPOFOL N/A 09/06/2022   Procedure: COLONOSCOPY WITH PROPOFOL;  Surgeon: Lucilla Lame, MD;  Location: [place];  Service: Endoscopy;  Laterality: N/A;  sleep apnea   DILATION AND CURETTAGE OF UTERUS     POLYPECTOMY  09/06/2022   Procedure: POLYPECTOMY;  Surgeon: Lucilla Lame, MD;  Location: [place];  Service: Endoscopy;;   TONSILLECTOMY     WISDOM TOOTH EXTRACTION      Social History   Tobacco Use   Smoking status: Never   Smokeless tobacco: Never  Vaping Use   Vaping Use: Never used  Substance Use Topics   Alcohol use: No    Alcohol/week: 0.0 standard drinks of alcohol   Drug  use: No     Medication list has been reviewed and updated.  Current Meds  Medication Sig   pantoprazole (PROTONIX) 40 MG tablet Take 1 tablet (40 mg total) by mouth daily.   pravastatin (PRAVACHOL) 20 MG tablet Take 1 tablet (20 mg total) by mouth daily.       10/18/2022    8:00 AM 08/30/2022    9:39 AM 05/19/2022    8:27 AM 02/26/2022   10:23 AM  GAD 7 : Generalized Anxiety Score  Nervous, Anxious, on Edge 0 0 0 0  Control/stop worrying 0 0 0 0  Worry too much - different  things 0 0 0 0  Trouble relaxing 0 0 0 0  Restless 0 0 0 0  Easily annoyed or irritable 0 0 0 0  Afraid - awful might happen 0 0 0 0  Total GAD 7 Score 0 0 0 0  Anxiety Difficulty Not difficult at all Not difficult at all Not difficult at all Not difficult at all       10/18/2022    8:00 AM 08/30/2022    9:39 AM 05/19/2022    8:26 AM  Depression screen PHQ 2/9  Decreased Interest 0 0 0  Down, Depressed, Hopeless 0 0 0  PHQ - 2 Score 0 0 0  Altered sleeping 0 0 0  Tired, decreased energy 0 0 0  Change in appetite 0 0 0  Feeling bad or failure about yourself  0 0 0  Trouble concentrating 0 0 0  Moving slowly or fidgety/restless 0 0 0  Suicidal thoughts 0 0 0  PHQ-9 Score 0 0 0  Difficult doing work/chores Not difficult at all Not difficult at all Not difficult at all    BP Readings from Last 3 Encounters:  10/18/22 120/76  09/06/22 117/74  08/30/22 120/76    Physical Exam Vitals and nursing note reviewed. Exam conducted with a chaperone present.  Constitutional:      General: She is not in acute distress.    Appearance: She is not diaphoretic.  HENT:     Head: Normocephalic and atraumatic.     Right Ear: Tympanic membrane, ear canal and external ear normal.     Left Ear: Tympanic membrane, ear canal and external ear normal.     Nose: Congestion present. No rhinorrhea.     Right Turbinates: Swollen.     Left Turbinates: Swollen.     Right Sinus: No maxillary sinus tenderness or frontal sinus tenderness.     Left Sinus: No maxillary sinus tenderness or frontal sinus tenderness.     Mouth/Throat:     Lips: Pink.     Mouth: Mucous membranes are moist.     Pharynx: Posterior oropharyngeal erythema present. No oropharyngeal exudate.  Eyes:     General:        Right eye: No discharge.        Left eye: No discharge.     Conjunctiva/sclera: Conjunctivae normal.     Pupils: Pupils are equal, round, and reactive to light.  Neck:     Thyroid: No thyromegaly.     Vascular:  No JVD.  Cardiovascular:     Rate and Rhythm: Normal rate and regular rhythm.     Heart sounds: Normal heart sounds. No murmur heard.    No friction rub. No gallop.  Pulmonary:     Effort: Pulmonary effort is normal.     Breath sounds: Normal breath sounds.  Abdominal:  General: Bowel sounds are normal.     Palpations: Abdomen is soft. There is no mass.     Tenderness: There is no abdominal tenderness. There is no guarding.  Musculoskeletal:        General: Normal range of motion.     Cervical back: Normal range of motion and neck supple.  Lymphadenopathy:     Cervical: No cervical adenopathy.  Skin:    General: Skin is warm and dry.  Neurological:     Mental Status: She is alert.     Deep Tendon Reflexes: Reflexes are normal and symmetric.     Wt Readings from Last 3 Encounters:  10/18/22 174 lb (78.9 kg)  09/06/22 170 lb (77.1 kg)  08/30/22 174 lb (78.9 kg)    BP 120/76   Pulse 82   Ht 5\' 6"  (1.676 m)   Wt 174 lb (78.9 kg)   SpO2 97%   BMI 28.08 kg/m   Assessment and Plan:  1. Acute non-recurrent maxillary sinusitis Acute.  Persistent.  Stable.  History and physical exam consistent with sinusitis.  Patient has done well on azithromycin and we will prescribe at 250 mg 2 today followed by 1 a day for the next 4 days.  Will recheck on as-needed basis. - azithromycin (ZITHROMAX) 250 MG tablet; Take 2 tablets on day 1, then 1 tablet daily on days 2 through 5  Dispense: 6 tablet; Refill: 1    Otilio Miu, MD

## 2022-11-15 ENCOUNTER — Ambulatory Visit: Payer: Self-pay | Attending: Hematology and Oncology | Admitting: Hematology and Oncology

## 2022-11-15 ENCOUNTER — Ambulatory Visit
Admission: RE | Admit: 2022-11-15 | Discharge: 2022-11-15 | Disposition: A | Discharge status: Home / Self Care | Payer: Self-pay | Source: Ambulatory Visit | Attending: Obstetrics and Gynecology | Admitting: Obstetrics and Gynecology

## 2022-11-15 VITALS — BP 144/82 | Wt 174.5 lb

## 2022-11-15 DIAGNOSIS — Z1231 Encounter for screening mammogram for malignant neoplasm of breast: Secondary | ICD-10-CM

## 2022-11-15 NOTE — Progress Notes (Signed)
Ms. GAE BIHL is a 62 y.o. female who presents to [facility] clinic today with no complaints.    Pap Smear: Pap not smear completed today. Last Pap smear was 2022 at [facility] and was normal. Per patient has no history of an abnormal Pap smear. Last Pap smear result is available in Epic.   Physical exam: Breasts Breasts symmetrical. No skin abnormalities bilateral breasts. No nipple retraction bilateral breasts. No nipple discharge bilateral breasts. No lymphadenopathy. No lumps palpated bilateral breasts.  MM CLIP PLACEMENT LEFT  Result Date: 07/28/2021 CLINICAL DATA:  Patient is post ultrasound-guided core needle biopsy of a 6 mm complex cystic mass over the 3 o'clock position of the left breast. EXAM: 3D DIAGNOSTIC LEFT MAMMOGRAM POST ULTRASOUND BIOPSY COMPARISON:  Previous exam(s). FINDINGS: 3D Mammographic images were obtained following ultrasound guided biopsy of the targeted mass over the 3 o'clock position. The biopsy marking clip is in expected position at the site of biopsy. IMPRESSION: Appropriate positioning of the venous shaped biopsy marking clip at the site of biopsy in the outer midportion of the left breast. Final Assessment: Post Procedure Mammograms for Marker Placement Electronically Signed   By: Elberta Fortis M.D.   On: 07/28/2021 09:34  MM DIAG BREAST TOMO UNI LEFT  Result Date: 07/13/2021 CLINICAL DATA:  Possible focal asymmetry in the outer left breast on a recent screening mammogram. EXAM: DIGITAL DIAGNOSTIC UNILATERAL LEFT MAMMOGRAM WITH TOMOSYNTHESIS AND CAD; ULTRASOUND LEFT BREAST LIMITED TECHNIQUE: Left digital diagnostic mammography and breast tomosynthesis was performed. The images were evaluated with computer-aided detection.; Targeted ultrasound examination of the left breast was performed. COMPARISON:  Previous exam(s). ACR Breast Density Category b: There are scattered areas of fibroglandular density. FINDINGS: 3D tomographic and 2D generated spot  compression images of the left breast confirm a small, oval, circumscribed mass in the outer left breast in the middle 3rd. On physical exam, no mass is palpable in the outer left breast. Targeted ultrasound is performed, showing a 6 x 4 x 4 mm oval, bilobed, circumscribed mass in the 3 o'clock position of the left breast, 5 cm from the nipple. This has anechoic, hypoechoic and medium echotexture components. This also has mildly thickened partial internal septations. There is a suggestion of a small amount of internal blood flow within the periphery of a hypoechoic component with power Doppler. Ultrasound of the left axilla demonstrated no abnormal left axillary lymph nodes. IMPRESSION: 6 mm complex or complicated cyst with possible peripheral internal blood flow in the 3 o'clock position of the left breast, 5 cm from the nipple. This could represent a benign complicated cyst or a papillary neoplasm. RECOMMENDATION: Ultrasound-guided core needle biopsy of the hypoechoic component of the 6 mm mass in the 3 o'clock position of the left breast. This has been discussed the patient and she is being assisted in getting this scheduled. I have discussed the findings and recommendations with the patient. If applicable, a reminder letter will be sent to the patient regarding the next appointment. BI-RADS CATEGORY  4: Suspicious. Electronically Signed   By: Beckie Salts M.D.   On: 07/13/2021 16:56  MM 3D SCREEN BREAST BILATERAL  Result Date: 06/29/2021 CLINICAL DATA:  Screening. EXAM: DIGITAL SCREENING BILATERAL MAMMOGRAM WITH TOMOSYNTHESIS AND CAD TECHNIQUE: Bilateral screening digital craniocaudal and mediolateral oblique mammograms were obtained. Bilateral screening digital breast tomosynthesis was performed. The images were evaluated with computer-aided detection. COMPARISON:  Previous exam(s). ACR Breast Density Category b: There are scattered areas of fibroglandular density. FINDINGS:  In the left breast, a possible  focal asymmetry warrants further evaluation. In the right breast, no findings suspicious for malignancy. IMPRESSION: Further evaluation is suggested for possible focal asymmetry in the left breast. RECOMMENDATION: Diagnostic mammogram and possibly ultrasound of the left breast. (Code:FI-L-21M) The patient will be contacted regarding the findings, and additional imaging will be scheduled. BI-RADS CATEGORY  0: Incomplete. Need additional imaging evaluation and/or prior mammograms for comparison. Electronically Signed   By: Meda Klinefelter M.D.   On: 06/29/2021 15:28  MM DIAG BREAST TOMO BILATERAL  Result Date: 06/18/2020 CLINICAL DATA:  Patient complains of diffuse intermittent left breast pain. EXAM: DIGITAL DIAGNOSTIC BILATERAL MAMMOGRAM WITH TOMO AND CAD COMPARISON:  Previous exam(s). ACR Breast Density Category b: There are scattered areas of fibroglandular density. FINDINGS: No suspicious mass, malignant type microcalcifications or distortion detected in either breast. Mammographic images were processed with CAD. IMPRESSION: No evidence of malignancy in either breast. RECOMMENDATION: Bilateral screening mammogram in 1 year is recommended. I have discussed the findings and recommendations with the patient. If applicable, a reminder letter will be sent to the patient regarding the next appointment. BI-RADS CATEGORY  1: Negative. Electronically Signed   By: Baird Lyons M.D.   On: 06/18/2020 09:43   MM 3D SCREEN BREAST BILATERAL  Result Date: 01/11/2018 CLINICAL DATA:  Screening. EXAM: DIGITAL SCREENING BILATERAL MAMMOGRAM WITH TOMO AND CAD COMPARISON:  Previous exam(s). ACR Breast Density Category b: There are scattered areas of fibroglandular density. FINDINGS: There are no findings suspicious for malignancy. Images were processed with CAD. IMPRESSION: No mammographic evidence of malignancy. A result letter of this screening mammogram will be mailed directly to the patient. RECOMMENDATION: Screening  mammogram in one year. (Code:SM-B-01Y) BI-RADS CATEGORY  1: Negative. Electronically Signed   By: Elberta Fortis M.D.   On: 01/11/2018 09:00         Pelvic/Bimanual Pap is not indicated today    Smoking History: Patient has never smoked and was not referred to quit line.    Patient Navigation: Patient education provided. Access to services provided for patient through [facility] program. No interpreter provided. No transportation provided   Colorectal Cancer Screening: Per patient has had colonoscopy completed on 09/06/2022 with tubular adneoma, negative for high-grade dysplasia and malignancy.   No complaints today.    Breast and Cervical Cancer Risk Assessment: Patient does not have family history of breast cancer, known genetic mutations, or radiation treatment to the chest before age 6. Patient does not have history of cervical dysplasia, immunocompromised, or DES exposure in-utero.  Risk Assessment   No risk assessment data     A: [facility] exam without pap smear No complaints with benign exam.   P: [facility]. Appointment scheduled 11/15/22.  Pascal Lux, NP 11/15/2022 8:39 AM

## 2022-11-15 NOTE — Patient Instructions (Signed)
Taught Kerri Preston about self breast awareness and gave educational materials to take home. Patient did not need a Pap smear today due to last Pap smear was in 2022 per patient.  Let her know [facility] will cover Pap smears every 5 years unless has a history of abnormal Pap smears. [facility] to [facility]. Appointment scheduled for 11/15/22. Patient aware of appointment and will be there. Let patient know will follow up with her within the next couple weeks with results. Kerri Preston verbalized understanding.  Pascal Lux, NP 8:40 AM

## 2023-02-28 ENCOUNTER — Other Ambulatory Visit
Admission: RE | Admit: 2023-02-28 | Discharge: 2023-02-28 | Disposition: A | Discharge status: Home / Self Care | Payer: Self-pay | Attending: Family Medicine | Admitting: Family Medicine

## 2023-02-28 ENCOUNTER — Encounter: Payer: Self-pay | Admitting: Family Medicine

## 2023-02-28 ENCOUNTER — Ambulatory Visit (INDEPENDENT_AMBULATORY_CARE_PROVIDER_SITE_OTHER): Payer: Self-pay | Admitting: Family Medicine

## 2023-02-28 VITALS — BP 118/72 | HR 70 | Ht 64.0 in | Wt 166.0 lb

## 2023-02-28 DIAGNOSIS — K219 Gastro-esophageal reflux disease without esophagitis: Secondary | ICD-10-CM

## 2023-02-28 DIAGNOSIS — E7800 Pure hypercholesterolemia, unspecified: Secondary | ICD-10-CM

## 2023-02-28 DIAGNOSIS — E785 Hyperlipidemia, unspecified: Secondary | ICD-10-CM | POA: Insufficient documentation

## 2023-02-28 LAB — COMPREHENSIVE METABOLIC PANEL
ALT: 15 U/L (ref 0–44)
AST: 17 U/L (ref 15–41)
Albumin: 4.2 g/dL (ref 3.5–5.0)
Alkaline Phosphatase: 59 U/L (ref 38–126)
Anion gap: 10 (ref 5–15)
BUN: 18 mg/dL (ref 8–23)
CO2: 26 mmol/L (ref 22–32)
Calcium: 8.8 mg/dL — ABNORMAL LOW (ref 8.9–10.3)
Chloride: 102 mmol/L (ref 98–111)
Creatinine, Ser: 0.85 mg/dL (ref 0.44–1.00)
GFR, Estimated: >60 mL/min (ref >60)
Glucose, Bld: 94 mg/dL (ref 70–99)
Potassium: 4 mmol/L (ref 3.5–5.1)
Sodium: 138 mmol/L (ref 135–145)
Total Bilirubin: 1 mg/dL (ref 0.3–1.2)
Total Protein: 7.5 g/dL (ref 6.5–8.1)

## 2023-02-28 LAB — LIPID PANEL
Cholesterol: 212 mg/dL — ABNORMAL HIGH (ref 0–200)
HDL: 52 mg/dL (ref >40)
LDL Cholesterol: 136 mg/dL — ABNORMAL HIGH (ref 0–99)
Total CHOL/HDL Ratio: 4.1 RATIO
Triglycerides: 122 mg/dL (ref <150)
VLDL: 24 mg/dL (ref 0–40)

## 2023-02-28 MED ORDER — PRAVASTATIN SODIUM 20 MG PO TABS: 20.0000 mg | ORAL_TABLET | Freq: Every day | ORAL | 1 refills | Status: DC

## 2023-02-28 MED ORDER — PANTOPRAZOLE SODIUM 40 MG PO TBEC: 40.0000 mg | DELAYED_RELEASE_TABLET | Freq: Every day | ORAL | 1 refills | Status: DC

## 2023-02-28 NOTE — Progress Notes (Signed)
Date:  02/28/2023   Name:  Kerri Preston   DOB:  1960-08-31   MRN:  [id number]   Chief Complaint: Gastroesophageal Reflux and Hyperlipidemia  Gastroesophageal Reflux She complains of heartburn. She reports no abdominal pain, no chest pain, no choking, no coughing, no dysphagia, no hoarse voice, no nausea or no wheezing. The current episode started more than 1 year ago. The problem occurs occasionally. The problem has been gradually improving. The heartburn is located in the substernum. The heartburn is of mild intensity. The symptoms are aggravated by certain foods. Pertinent negatives include no fatigue. She has tried a PPI for the symptoms. The treatment provided moderate relief.  Hyperlipidemia This is a chronic problem. The current episode started more than 1 year ago. The problem is controlled. Recent lipid tests were reviewed and are normal. She has no history of chronic renal disease. Pertinent negatives include no chest pain. Current antihyperlipidemic treatment includes statins. The current treatment provides moderate improvement of lipids. There are no compliance problems.  Risk factors for coronary artery disease include dyslipidemia and hypertension.    Lab Results  Component Value Date   NA 142 02/26/2022   K 4.2 02/26/2022   CO2 24 02/26/2022   GLUCOSE 91 02/26/2022   BUN 15 02/26/2022   CREATININE 0.90 02/26/2022   CALCIUM 9.8 02/26/2022   EGFR 72 02/26/2022   GFRNONAA 65 09/24/2020   Lab Results  Component Value Date   CHOL 212 (H) 08/30/2022   HDL 64 08/30/2022   LDLCALC 135 (H) 08/30/2022   TRIG 74 08/30/2022   CHOLHDL 5.0 (H) 01/12/2018   No results found for: "TSH" No results found for: "HGBA1C" No results found for: "WBC", "HGB", "HCT", "MCV", "PLT" Lab Results  Component Value Date   ALT 16 02/26/2022   AST 17 02/26/2022   ALKPHOS 78 02/26/2022   BILITOT 0.7 02/26/2022   No results found for: "25OHVITD2", "25OHVITD3", "VD25OH"   Review of Systems   Constitutional:  Negative for fatigue and unexpected weight change.  HENT:  Negative for hoarse voice and trouble swallowing.   Respiratory:  Negative for cough, choking, wheezing and stridor.   Cardiovascular:  Negative for chest pain, palpitations and leg swelling.  Gastrointestinal:  Positive for heartburn. Negative for abdominal pain, blood in stool, constipation, diarrhea, dysphagia, nausea and vomiting.  Genitourinary:  Negative for difficulty urinating, menstrual problem and vaginal bleeding.  Neurological:  Negative for headaches.  Hematological:  Negative for adenopathy. Does not bruise/bleed easily.    Patient Active Problem List   Diagnosis Date Noted   Encounter for screening colonoscopy 09/06/2022   Polyp of ascending colon 09/06/2022   Hyperlipidemia, mixed 04/02/2021   Stable angina pectoris 04/02/2021   Reactive airway disease, mild intermittent, with acute exacerbation 09/30/2016    No Known Allergies  Past Surgical History:  Procedure Laterality Date   BILATERAL TEMPOROMANDIBULAR JOINT ARTHROPLASTY     BREAST BIOPSY Left 07/28/2021   U/S bx 3:00, venus clip, benign   COLONOSCOPY  2012   cleared for 10 yrs   COLONOSCOPY WITH PROPOFOL N/A 09/06/2022   Procedure: COLONOSCOPY WITH PROPOFOL;  Surgeon: Midge Minium, MD;  Location: [facility];  Service: Endoscopy;  Laterality: N/A;  sleep apnea   DILATION AND CURETTAGE OF UTERUS     POLYPECTOMY  09/06/2022   Procedure: POLYPECTOMY;  Surgeon: Midge Minium, MD;  Location: [facility];  Service: Endoscopy;;   TONSILLECTOMY     WISDOM TOOTH EXTRACTION  Social History   Tobacco Use   Smoking status: Never   Smokeless tobacco: Never  Vaping Use   Vaping status: Never Used  Substance Use Topics   Alcohol use: No    Alcohol/week: 0.0 standard drinks of alcohol   Drug use: No     Medication list has been reviewed and updated.  Current Meds  Medication Sig   pantoprazole (PROTONIX) 40 MG  tablet Take 1 tablet (40 mg total) by mouth daily.   pravastatin (PRAVACHOL) 20 MG tablet Take 1 tablet (20 mg total) by mouth daily.       02/28/2023    9:04 AM 10/18/2022    8:00 AM 08/30/2022    9:39 AM 05/19/2022    8:27 AM  GAD 7 : Generalized Anxiety Score  Nervous, Anxious, on Edge 0 0 0 0  Control/stop worrying 0 0 0 0  Worry too much - different things 0 0 0 0  Trouble relaxing 0 0 0 0  Restless 0 0 0 0  Easily annoyed or irritable 0 0 0 0  Afraid - awful might happen 0 0 0 0  Total GAD 7 Score 0 0 0 0  Anxiety Difficulty Not difficult at all Not difficult at all Not difficult at all Not difficult at all       02/28/2023    9:03 AM 10/18/2022    8:00 AM 08/30/2022    9:39 AM  Depression screen PHQ 2/9  Decreased Interest 0 0 0  Down, Depressed, Hopeless 0 0 0  PHQ - 2 Score 0 0 0  Altered sleeping 0 0 0  Tired, decreased energy 0 0 0  Change in appetite 0 0 0  Feeling bad or failure about yourself  0 0 0  Trouble concentrating 0 0 0  Moving slowly or fidgety/restless 0 0 0  Suicidal thoughts 0 0 0  PHQ-9 Score 0 0 0  Difficult doing work/chores Not difficult at all Not difficult at all Not difficult at all    BP Readings from Last 3 Encounters:  02/28/23 118/72  11/15/22 (!) 144/82  10/18/22 120/76    Physical Exam Vitals and nursing note reviewed. Exam conducted with a chaperone present.  Constitutional:      General: She is not in acute distress.    Appearance: She is not diaphoretic.  HENT:     Head: Normocephalic and atraumatic.     Right Ear: Tympanic membrane and external ear normal.     Left Ear: Tympanic membrane and external ear normal.     Nose: Nose normal.     Mouth/Throat:     Mouth: Mucous membranes are moist.  Eyes:     General:        Right eye: No discharge.        Left eye: No discharge.     Conjunctiva/sclera: Conjunctivae normal.     Pupils: Pupils are equal, round, and reactive to light.  Neck:     Thyroid: No thyromegaly.      Vascular: No JVD.  Cardiovascular:     Rate and Rhythm: Normal rate and regular rhythm.     Pulses:          Radial pulses are 2+ on the right side and 2+ on the left side.     Heart sounds: Normal heart sounds, S1 normal and S2 normal. No murmur heard.    No systolic murmur is present.     No diastolic murmur is present.  No friction rub. No gallop. No S3 or S4 sounds.  Pulmonary:     Effort: Pulmonary effort is normal.     Breath sounds: Normal breath sounds. No wheezing, rhonchi or rales.  Abdominal:     General: Bowel sounds are normal.     Palpations: Abdomen is soft. There is no hepatomegaly, splenomegaly or mass.     Tenderness: There is no abdominal tenderness. There is no guarding or rebound.  Musculoskeletal:        General: Normal range of motion.     Cervical back: Normal range of motion and neck supple.  Lymphadenopathy:     Cervical: No cervical adenopathy.  Skin:    General: Skin is warm and dry.  Neurological:     Mental Status: She is alert.     Deep Tendon Reflexes: Reflexes are normal and symmetric.     Reflex Scores:      Patellar reflexes are 2+ on the right side and 2+ on the left side.    Wt Readings from Last 3 Encounters:  02/28/23 166 lb (75.3 kg)  11/15/22 174 lb 8 oz (79.2 kg)  10/18/22 174 lb (78.9 kg)    BP 118/72   Pulse 70   Ht 5\' 4"  (1.626 m)   Wt 166 lb (75.3 kg)   SpO2 96%   BMI 28.49 kg/m   Assessment and Plan:  1. Gastroesophageal reflux disease, unspecified whether esophagitis present Chronic.  Controlled.  Stable.  Continue pantoprazole 40 mg once a day.  Also suggested that may get some Pepcid/famotidine 20 mg to take if there is breakthrough reflux at night with a late dinner along with a chewable Tums.  Will recheck in 6 months. - pantoprazole (PROTONIX) 40 MG tablet; Take 1 tablet (40 mg total) by mouth daily.  Dispense: 90 tablet; Refill: 1  2. Pure hypercholesterolemia .  Controlled.  Stable.  Asymptomatic.   Tolerating medication well.  Continue pravastatin 20 mg once a day.  Will recheck with labs of lipid panel and CMP for hepatic profile today.  Will recheck in 6 months. - pravastatin (PRAVACHOL) 20 MG tablet; Take 1 tablet (20 mg total) by mouth daily.  Dispense: 90 tablet; Refill: 1    Elizabeth Sauer, MD

## 2023-03-01 ENCOUNTER — Other Ambulatory Visit: Payer: Self-pay

## 2023-03-01 DIAGNOSIS — E7800 Pure hypercholesterolemia, unspecified: Secondary | ICD-10-CM

## 2023-03-01 MED ORDER — ROSUVASTATIN CALCIUM 10 MG PO TABS: 10.0000 mg | ORAL_TABLET | Freq: Every day | ORAL | 0 refills | Status: DC

## 2023-03-31 ENCOUNTER — Other Ambulatory Visit: Payer: Self-pay | Admitting: Family Medicine

## 2023-04-01 NOTE — Telephone Encounter (Signed)
Requested medication (s) are due for refill today: No  Requested medication (s) are on the active medication list: No  Last refill:    Future visit scheduled: No  Notes to clinic:  Not on pt.'s medication list.    Requested Prescriptions  Pending Prescriptions Disp Refills   pravastatin (PRAVACHOL) 20 MG tablet [Pharmacy Med Name: PRAVASTATIN 20MG  TABLETS] 90 tablet     Sig: TAKE 1 TABLET BY MOUTH ONCE DAILY     Cardiovascular:  Antilipid - Statins Failed - 03/31/2023  4:45 PM      Failed - Lipid Panel in normal range within the last 12 months    Cholesterol, Total  Date Value Ref Range Status  08/30/2022 212 (H) 100 - 199 mg/dL Final   Cholesterol  Date Value Ref Range Status  02/28/2023 212 (H) 0 - 200 mg/dL Final   LDL Chol Calc (NIH)  Date Value Ref Range Status  08/30/2022 135 (H) 0 - 99 mg/dL Final   LDL Cholesterol  Date Value Ref Range Status  02/28/2023 136 (H) 0 - 99 mg/dL Final    Comment:           Total Cholesterol/HDL:CHD Risk Coronary Heart Disease Risk Table                     Men   Women  1/2 Average Risk   3.4   3.3  Average Risk       5.0   4.4  2 X Average Risk   9.6   7.1  3 X Average Risk  23.4   11.0        Use the calculated Patient Ratio above and the CHD Risk Table to determine the patient's CHD Risk.        ATP III CLASSIFICATION (LDL):  <100     mg/dL   Optimal  960-454  mg/dL   Near or Above                    Optimal  130-159  mg/dL   Borderline  098-119  mg/dL   High  >147     mg/dL   Very High Performed at [facility], [address] [place], [place] [address]    HDL  Date Value Ref Range Status  02/28/2023 52 >40 mg/dL Final  21/30/8657 64 >84 mg/dL Final   Triglycerides  Date Value Ref Range Status  02/28/2023 122 <150 mg/dL Final         Passed - Patient is not pregnant      Passed - Valid encounter within last 12 months    Recent Outpatient Visits           1 month ago Gastroesophageal reflux  disease, unspecified whether esophagitis present   [place] [facility]   5 months ago Acute non-recurrent maxillary sinusitis   [place] [facility]   7 months ago Pure hypercholesterolemia   [facility]   10 months ago Acute non-recurrent maxillary sinusitis   [place] [facility]   1 year ago Gastroesophageal reflux disease, unspecified whether esophagitis present   [facility]

## 2023-04-19 ENCOUNTER — Telehealth: Payer: Self-pay

## 2023-04-19 NOTE — Transitions of Care (Post Inpatient/ED Visit) (Signed)
04/19/2023  Name: Kerri Preston MRN: [id number] DOB: [birth year]  Today's TOC FU Call Status: Today's TOC FU Call Status:: Successful TOC FU Call Completed TOC FU Call Complete Date: 04/19/23 Patient's Name and Date of Birth confirmed.  Transition Care Management Follow-up Telephone Call Date of Discharge: 04/16/23 Discharge Facility: Other [facility]) Name of Other (Non[facility]) Discharge Facility: [facility] Type of Discharge: Emergency Department Reason for ED Visit: Other: (hematuria) How have you been since you were released from the hospital?: Better Any questions or concerns?: No  Items Reviewed: Did you receive and understand the discharge instructions provided?: Yes Medications obtained,verified, and reconciled?: Yes (Medications Reviewed) Any new allergies since your discharge?: No Dietary orders reviewed?: NA Do you have support at home?: Yes People in Home: spouse Name of Support/Comfort Primary Source: Robbie  Medications Reviewed Today: Medications Reviewed Today     Reviewed by Everitt Amber (Medical Assistant) on 04/19/23 at [phone]  Med List Status: <None>   Medication Order Taking? Sig Documenting Provider Last Dose Status Informant  pantoprazole (PROTONIX) 40 MG tablet [id number] Yes Take 1 tablet (40 mg total) by mouth daily. Duanne Limerick, MD Taking Active   rivaroxaban (XARELTO) 20 MG TABS tablet [id number] Yes Take 20 mg by mouth daily with supper. hematology [provider] Taking Active   rosuvastatin (CRESTOR) 10 MG tablet [id number] Yes Take 1 tablet (10 mg total) by mouth daily. Duanne Limerick, MD Taking Active             Home Care and Equipment/Supplies: Were Home Health Services Ordered?: No Any new equipment or medical supplies ordered?: Yes (crutches/walker) Name of Medical supply agency?: no HH Were you able to get the equipment/medical supplies?: Yes Do you have any questions related to the use of the  equipment/supplies?: No  Functional Questionnaire: Do you need assistance with bathing/showering or dressing?: No Do you need assistance with meal preparation?: No Do you need assistance with eating?: No Do you have difficulty maintaining continence: No Do you need assistance with getting out of bed/getting out of a chair/moving?: No Do you have difficulty managing or taking your medications?: No  Follow up appointments reviewed: PCP Follow-up appointment confirmed?: No (will see hematology for f/up) MD Provider Line [phone] Given: Yes Specialist Hospital Follow-up appointment confirmed?: Yes Date of Specialist follow-up appointment?:  (upcoming/ does not know exact date) Follow-Up Specialty Provider:: [facility] hematology Do you need transportation to your follow-up appointment?: No Do you understand care options if your condition(s) worsen?: Yes-patient verbalized understanding    SIGNATURE Arthur Holms

## 2023-04-22 ENCOUNTER — Other Ambulatory Visit: Payer: Self-pay | Admitting: Family Medicine

## 2023-04-22 DIAGNOSIS — K219 Gastro-esophageal reflux disease without esophagitis: Secondary | ICD-10-CM

## 2023-04-25 NOTE — Telephone Encounter (Signed)
Requested Prescriptions  Refused Prescriptions Disp Refills   pantoprazole (PROTONIX) 40 MG tablet [Pharmacy Med Name: Pantoprazole Sodium 40 MG Oral Tablet Delayed Release] 90 tablet 0    Sig: TAKE 1  BY MOUTH ONCE DAILY     Gastroenterology: Proton Pump Inhibitors Passed - 04/22/2023  8:54 AM      Passed - Valid encounter within last 12 months    Recent Outpatient Visits           1 month ago Gastroesophageal reflux disease, unspecified whether esophagitis present   [place] [facility]   6 months ago Acute non-recurrent maxillary sinusitis   [place] [facility]   7 months ago Pure hypercholesterolemia   [facility]   11 months ago Acute non-recurrent maxillary sinusitis   [place] [facility]   1 year ago Gastroesophageal reflux disease, unspecified whether esophagitis present   [facility]

## 2023-04-26 ENCOUNTER — Other Ambulatory Visit: Payer: Self-pay | Admitting: Family Medicine

## 2023-04-26 DIAGNOSIS — K219 Gastro-esophageal reflux disease without esophagitis: Secondary | ICD-10-CM

## 2023-04-27 NOTE — Telephone Encounter (Signed)
Reordered 02/28/23 #90 1 RF  Requested Prescriptions  Refused Prescriptions Disp Refills   pantoprazole (PROTONIX) 40 MG tablet [Pharmacy Med Name: Pantoprazole Sodium 40 MG Oral Tablet Delayed Release] 90 tablet 0    Sig: TAKE 1  BY MOUTH ONCE DAILY     Gastroenterology: Proton Pump Inhibitors Passed - 04/26/2023 10:53 AM      Passed - Valid encounter within last 12 months    Recent Outpatient Visits           1 month ago Gastroesophageal reflux disease, unspecified whether esophagitis present   [place] [facility]   6 months ago Acute non-recurrent maxillary sinusitis   [place] [facility]   8 months ago Pure hypercholesterolemia   [facility]   11 months ago Acute non-recurrent maxillary sinusitis   [place] [facility]   1 year ago Gastroesophageal reflux disease, unspecified whether esophagitis present   [facility]

## 2023-04-28 ENCOUNTER — Other Ambulatory Visit: Payer: Self-pay | Admitting: Family Medicine

## 2023-04-28 DIAGNOSIS — K219 Gastro-esophageal reflux disease without esophagitis: Secondary | ICD-10-CM

## 2023-04-28 NOTE — Telephone Encounter (Signed)
Medication Refill - Medication:  pantoprazole (PROTONIX) 40 MG tablet  completely out of medication  Has the patient contacted their pharmacy? Yes, pharmacy faxed over a request, says refill requested too soon  Preferred Pharmacy (with phone number or street name):  [facility] [address] [place] - [address]  Phone: [phone] Fax: [phone]   Has the patient been seen for an appointment in the last year OR does the patient have an upcoming appointment? YES, last seen on 7.29.24

## 2023-04-28 NOTE — Telephone Encounter (Signed)
Requested Prescriptions  Pending Prescriptions Disp Refills   pantoprazole (PROTONIX) 40 MG tablet 90 tablet 1    Sig: Take 1 tablet (40 mg total) by mouth daily.     Gastroenterology: Proton Pump Inhibitors Passed - 04/28/2023 11:22 AM      Passed - Valid encounter within last 12 months    Recent Outpatient Visits           1 month ago Gastroesophageal reflux disease, unspecified whether esophagitis present   [facility]   6 months ago Acute non-recurrent maxillary sinusitis   [place] [facility]   8 months ago Pure hypercholesterolemia   [facility]   11 months ago Acute non-recurrent maxillary sinusitis   [place] [facility]   1 year ago Gastroesophageal reflux disease, unspecified whether esophagitis present   [facility]

## 2023-04-28 NOTE — Telephone Encounter (Signed)
Resend to [facility] in [place].

## 2023-04-29 ENCOUNTER — Other Ambulatory Visit: Payer: Self-pay

## 2023-04-29 DIAGNOSIS — K219 Gastro-esophageal reflux disease without esophagitis: Secondary | ICD-10-CM

## 2023-05-28 ENCOUNTER — Other Ambulatory Visit: Payer: Self-pay | Admitting: Family Medicine

## 2023-05-28 DIAGNOSIS — E7800 Pure hypercholesterolemia, unspecified: Secondary | ICD-10-CM

## 2023-08-26 ENCOUNTER — Other Ambulatory Visit: Payer: Self-pay | Admitting: Family Medicine

## 2023-08-26 DIAGNOSIS — E7800 Pure hypercholesterolemia, unspecified: Secondary | ICD-10-CM

## 2023-10-24 ENCOUNTER — Other Ambulatory Visit: Payer: Self-pay | Admitting: Family Medicine

## 2023-10-24 DIAGNOSIS — K219 Gastro-esophageal reflux disease without esophagitis: Secondary | ICD-10-CM

## 2023-10-25 NOTE — Telephone Encounter (Signed)
 Requested Prescriptions  Pending Prescriptions Disp Refills   pantoprazole (PROTONIX) 40 MG tablet [Pharmacy Med Name: PANTOPRAZOLE SOD DR 40 MG TAB] 90 tablet 1    Sig: TAKE 1 TABLET BY MOUTH DAILY     Gastroenterology: Proton Pump Inhibitors Passed - 10/25/2023 11:15 AM      Passed - Valid encounter within last 12 months    Recent Outpatient Visits           7 months ago Gastroesophageal reflux disease, unspecified whether esophagitis present   [place] [facility]   1 year ago Acute non-recurrent maxillary sinusitis   [place] [facility]   1 year ago Pure hypercholesterolemia   [place] [facility]   1 year ago Acute non-recurrent maxillary sinusitis   [place] [facility]   1 year ago Gastroesophageal reflux disease, unspecified whether esophagitis present   [facility]

## 2023-11-26 ENCOUNTER — Other Ambulatory Visit: Payer: Self-pay | Admitting: Family Medicine

## 2023-11-26 DIAGNOSIS — E7800 Pure hypercholesterolemia, unspecified: Secondary | ICD-10-CM

## 2023-12-12 ENCOUNTER — Telehealth: Payer: Self-pay

## 2023-12-12 ENCOUNTER — Other Ambulatory Visit: Payer: Self-pay | Admitting: Family Medicine

## 2023-12-12 DIAGNOSIS — Z1231 Encounter for screening mammogram for malignant neoplasm of breast: Secondary | ICD-10-CM

## 2023-12-12 NOTE — Telephone Encounter (Signed)
 Spoke with patient and informed her that she does need to fast for labs.  JM  Copied from CRM [id number]. Topic: Appointments - Appointment Scheduling >> Dec 12, 2023 10:32 AM Kerri Preston wrote: Patient scheduled phys but wants to know if she needs to do fasting labs.. number on file is good.

## 2023-12-19 ENCOUNTER — Encounter: Payer: Self-pay | Admitting: Family Medicine

## 2023-12-19 ENCOUNTER — Ambulatory Visit (INDEPENDENT_AMBULATORY_CARE_PROVIDER_SITE_OTHER): Payer: Self-pay | Admitting: Family Medicine

## 2023-12-19 VITALS — BP 110/76 | HR 71 | Ht 64.0 in | Wt 171.0 lb

## 2023-12-19 DIAGNOSIS — Z0000 Encounter for general adult medical examination without abnormal findings: Secondary | ICD-10-CM

## 2023-12-19 DIAGNOSIS — Z780 Asymptomatic menopausal state: Secondary | ICD-10-CM | POA: Diagnosis not present

## 2023-12-19 DIAGNOSIS — E7800 Pure hypercholesterolemia, unspecified: Secondary | ICD-10-CM

## 2023-12-19 DIAGNOSIS — K219 Gastro-esophageal reflux disease without esophagitis: Secondary | ICD-10-CM | POA: Diagnosis not present

## 2023-12-19 MED ORDER — PANTOPRAZOLE SODIUM 40 MG PO TBEC: 40.0000 mg | DELAYED_RELEASE_TABLET | Freq: Every day | ORAL | 1 refills | Status: AC

## 2023-12-19 MED ORDER — ROSUVASTATIN CALCIUM 10 MG PO TABS
10.0000 mg | ORAL_TABLET | Freq: Every day | ORAL | 1 refills | Status: AC
Start: 2023-12-19 — End: ?

## 2023-12-19 NOTE — Progress Notes (Signed)
 Date:  12/19/2023   Name:  Kerri Preston   DOB:  1961-01-10   MRN:  [id number]   Chief Complaint: Annual Exam  Patient is a 63 year old female who presents for a comprehensive physical exam. The patient reports the following problems: ankle /leg pain. Health maintenance has been reviewed up to date.      Lab Results  Component Value Date   NA 138 02/28/2023   K 4.0 02/28/2023   CO2 26 02/28/2023   GLUCOSE 94 02/28/2023   BUN 18 02/28/2023   CREATININE 0.85 02/28/2023   CALCIUM  8.8 (L) 02/28/2023   EGFR 72 02/26/2022   GFRNONAA >60 02/28/2023   Lab Results  Component Value Date   CHOL 212 (H) 02/28/2023   HDL 52 02/28/2023   LDLCALC 136 (H) 02/28/2023   TRIG 122 02/28/2023   CHOLHDL 4.1 02/28/2023   No results found for: "TSH" No results found for: "HGBA1C" No results found for: "WBC", "HGB", "HCT", "MCV", "PLT" Lab Results  Component Value Date   ALT 15 02/28/2023   AST 17 02/28/2023   ALKPHOS 59 02/28/2023   BILITOT 1.0 02/28/2023   No results found for: "25OHVITD2", "25OHVITD3", "VD25OH"   Review of Systems  Constitutional: Negative.  Negative for chills, fatigue, fever and unexpected weight change.  HENT:  Negative for congestion, ear discharge, ear pain, rhinorrhea, sinus pressure, sneezing and sore throat.   Respiratory:  Negative for cough, shortness of breath, wheezing and stridor.   Gastrointestinal:  Negative for abdominal pain, blood in stool, constipation, diarrhea and nausea.  Genitourinary:  Negative for dysuria, flank pain, frequency, hematuria, urgency and vaginal discharge.  Musculoskeletal:  Negative for arthralgias, back pain and myalgias.  Skin:  Negative for rash.  Neurological:  Negative for dizziness, weakness and headaches.  Hematological:  Negative for adenopathy. Does not bruise/bleed easily.  Psychiatric/Behavioral:  Negative for dysphoric mood. The patient is not nervous/anxious.     Patient Active Problem List   Diagnosis Date  Noted   Encounter for screening colonoscopy 09/06/2022   Polyp of ascending colon 09/06/2022   GERD (gastroesophageal reflux disease) 08/13/2021   Hyperlipidemia, mixed 04/02/2021   Stable angina pectoris (HCC) 04/02/2021   Reactive airway disease, mild intermittent, with acute exacerbation 09/30/2016    No Known Allergies  Past Surgical History:  Procedure Laterality Date   BILATERAL TEMPOROMANDIBULAR JOINT ARTHROPLASTY     BREAST BIOPSY Left 07/28/2021   U/S bx 3:00, venus clip, benign   COLONOSCOPY  2012   cleared for 10 yrs   COLONOSCOPY WITH PROPOFOL  N/A 09/06/2022   Procedure: COLONOSCOPY WITH PROPOFOL ;  Surgeon: Marnee Sink, MD;  [facility];  Service: Endoscopy;  Laterality: N/A;  sleep apnea   DILATION AND CURETTAGE OF UTERUS     POLYPECTOMY  09/06/2022   Procedure: POLYPECTOMY;  Surgeon: Marnee Sink, MD;  Location: [facility];  Service: Endoscopy;;   TONSILLECTOMY     WISDOM TOOTH EXTRACTION      Social History   Tobacco Use   Smoking status: Never   Smokeless tobacco: Never  Vaping Use   Vaping status: Never Used  Substance Use Topics   Alcohol use: No    Alcohol/week: 0.0 standard drinks of alcohol   Drug use: No     Medication list has been reviewed and updated.  Current Meds  Medication Sig   pantoprazole  (PROTONIX ) 40 MG tablet TAKE 1 TABLET BY MOUTH DAILY   rosuvastatin  (CRESTOR ) 10 MG tablet  TAKE 1 TABLET BY MOUTH DAILY   [DISCONTINUED] apixaban (ELIQUIS) 5 MG TABS tablet Take 5 mg by mouth.   [DISCONTINUED] rivaroxaban (XARELTO) 20 MG TABS tablet Take 20 mg by mouth daily with supper. hematology       12/19/2023    8:20 AM 02/28/2023    9:04 AM 10/18/2022    8:00 AM 08/30/2022    9:39 AM  GAD 7 : Generalized Anxiety Score  Nervous, Anxious, on Edge 0 0 0 0  Control/stop worrying 0 0 0 0  Worry too much - different things 0 0 0 0  Trouble relaxing 0 0 0 0  Restless 0 0 0 0  Easily annoyed or irritable 0 0 0 0   Afraid - awful might happen 0 0 0 0  Total GAD 7 Score 0 0 0 0  Anxiety Difficulty Not difficult at all Not difficult at all Not difficult at all Not difficult at all       12/19/2023    8:20 AM 02/28/2023    9:03 AM 10/18/2022    8:00 AM  Depression screen PHQ 2/9  Decreased Interest 0 0 0  Down, Depressed, Hopeless 0 0 0  PHQ - 2 Score 0 0 0  Altered sleeping  0 0  Tired, decreased energy  0 0  Change in appetite  0 0  Feeling bad or failure about yourself   0 0  Trouble concentrating  0 0  Moving slowly or fidgety/restless  0 0  Suicidal thoughts  0 0  PHQ-9 Score  0 0  Difficult doing work/chores  Not difficult at all Not difficult at all    BP Readings from Last 3 Encounters:  12/19/23 110/76  02/28/23 118/72  11/15/22 (!) 144/82    Physical Exam Vitals and nursing note reviewed.  Constitutional:      General: She is not in acute distress.    Appearance: She is not diaphoretic.  HENT:     Head: Normocephalic and atraumatic.     Right Ear: External ear normal.     Left Ear: External ear normal.     Nose: Nose normal. No congestion or rhinorrhea.     Mouth/Throat:     Mouth: Mucous membranes are moist.  Eyes:     General:        Right eye: No discharge.        Left eye: No discharge.     Conjunctiva/sclera: Conjunctivae normal.     Pupils: Pupils are equal, round, and reactive to light.  Neck:     Thyroid: No thyromegaly.     Vascular: No JVD.  Cardiovascular:     Rate and Rhythm: Normal rate and regular rhythm.     Heart sounds: Normal heart sounds. No murmur heard.    No friction rub. No gallop.  Pulmonary:     Effort: Pulmonary effort is normal.     Breath sounds: Normal breath sounds. No decreased breath sounds, wheezing, rhonchi or rales.  Chest:     Comments: Breast exam deferred by patient Abdominal:     General: Bowel sounds are normal.     Palpations: Abdomen is soft. There is no hepatomegaly, splenomegaly or mass.     Tenderness: There is no  abdominal tenderness. There is no guarding.  Genitourinary:    Rectum: Normal. Guaiac result negative. No mass.  Musculoskeletal:        General: Normal range of motion.     Cervical back: Normal range  of motion and neck supple.  Lymphadenopathy:     Head:     Right side of head: No submandibular adenopathy.     Left side of head: No submandibular adenopathy.     Cervical: No cervical adenopathy.     Upper Body:     Right upper body: No supraclavicular adenopathy.     Left upper body: No supraclavicular adenopathy.     Lower Body: No right inguinal adenopathy. No left inguinal adenopathy.  Skin:    General: Skin is warm and dry.     Capillary Refill: Capillary refill takes less than 2 seconds.  Neurological:     Mental Status: She is alert.     Cranial Nerves: Cranial nerves 2-12 are intact.     Sensory: Sensation is intact.     Motor: Motor function is intact.     Coordination: Romberg sign negative.     Deep Tendon Reflexes: Reflexes are normal and symmetric.     Reflex Scores:      Tricep reflexes are 2+ on the right side and 2+ on the left side.      Bicep reflexes are 2+ on the right side and 2+ on the left side.      Brachioradialis reflexes are 2+ on the right side and 2+ on the left side.      Patellar reflexes are 2+ on the right side and 2+ on the left side.      Achilles reflexes are 2+ on the right side and 2+ on the left side.    Wt Readings from Last 3 Encounters:  12/19/23 171 lb (77.6 kg)  02/28/23 166 lb (75.3 kg)  11/15/22 174 lb 8 oz (79.2 kg)    BP 110/76   Pulse 71   Ht 5\' 4"  (1.626 m)   Wt 171 lb (77.6 kg)   SpO2 97%   BMI 29.35 kg/m   Assessment and Plan: Kerri Preston is a 63 y.o. female who presents today for her Complete Annual Exam. She feels well. She reports exercisingwalk/swim . She reports she is sleeping well. Immunizations are reviewed and recommendations provided.   Age appropriate screening tests are discussed. Counseling given for  risk factor reduction interventions.    1. Annual physical exam (Primary) No subjective/objective concerns noted on HPI, review of past medical health/review of medications/review of within last 6 months labs.  Will check CMP with GFR and lipid panel for current stabilization of labs. - Comprehensive metabolic panel with GFR - Lipid Panel With LDL/HDL Ratio  2. Gastroesophageal reflux disease, unspecified whether esophagitis present Chronic.  Controlled.  Stable.  Will refill pantoprazole  40 mg once a day. - pantoprazole  (PROTONIX ) 40 MG tablet; Take 1 tablet (40 mg total) by mouth daily.  Dispense: 90 tablet; Refill: 1  3. Pure hypercholesterolemia .  Controlled.  Stable.  Will refill rosuvastatin  10 mg once a day. - rosuvastatin  (CRESTOR ) 10 MG tablet; Take 1 tablet (10 mg total) by mouth daily.  Dispense: 90 tablet; Refill: 1  4. Menopause Chronic.  Controlled.  Stable we will check bone density.  For current stability. - DG Bone Density   Kerri Allis, MD

## 2023-12-19 NOTE — Patient Instructions (Signed)

## 2023-12-20 LAB — COMPREHENSIVE METABOLIC PANEL WITH GFR
ALT: 17 IU/L (ref 0–32)
AST: 19 IU/L (ref 0–40)
Albumin: 4.6 g/dL (ref 3.9–4.9)
Alkaline Phosphatase: 78 IU/L (ref 44–121)
BUN/Creatinine Ratio: 22 (ref 12–28)
BUN: 20 mg/dL (ref 8–27)
Bilirubin Total: 1.1 mg/dL (ref 0.0–1.2)
CO2: 22 mmol/L (ref 20–29)
Calcium: 9.6 mg/dL (ref 8.7–10.3)
Chloride: 105 mmol/L (ref 96–106)
Creatinine, Ser: 0.93 mg/dL (ref 0.57–1.00)
Globulin, Total: 2.5 g/dL (ref 1.5–4.5)
Glucose: 96 mg/dL (ref 70–99)
Potassium: 4.6 mmol/L (ref 3.5–5.2)
Sodium: 143 mmol/L (ref 134–144)
Total Protein: 7.1 g/dL (ref 6.0–8.5)
eGFR: 68 mL/min/{1.73_m2} (ref >59)

## 2023-12-20 LAB — LIPID PANEL WITH LDL/HDL RATIO
Cholesterol, Total: 167 mg/dL (ref 100–199)
HDL: 56 mg/dL (ref >39)
LDL Chol Calc (NIH): 89 mg/dL (ref 0–99)
LDL/HDL Ratio: 1.6 ratio (ref 0.0–3.2)
Triglycerides: 124 mg/dL (ref 0–149)
VLDL Cholesterol Cal: 22 mg/dL (ref 5–40)

## 2023-12-21 ENCOUNTER — Ambulatory Visit: Payer: Self-pay | Admitting: Family Medicine

## 2023-12-21 ENCOUNTER — Ambulatory Visit: Payer: Self-pay

## 2024-01-03 ENCOUNTER — Ambulatory Visit
Admission: RE | Admit: 2024-01-03 | Discharge: 2024-01-03 | Disposition: A | Discharge status: Home / Self Care | Payer: Self-pay | Source: Ambulatory Visit | Attending: Family Medicine | Admitting: Family Medicine

## 2024-01-03 DIAGNOSIS — Z1231 Encounter for screening mammogram for malignant neoplasm of breast: Secondary | ICD-10-CM | POA: Diagnosis present

## 2024-02-09 ENCOUNTER — Other Ambulatory Visit

## 2024-09-03 ENCOUNTER — Telehealth: Admitting: Nurse Practitioner

## 2024-09-03 DIAGNOSIS — R051 Acute cough: Secondary | ICD-10-CM

## 2024-09-03 DIAGNOSIS — B9689 Other specified bacterial agents as the cause of diseases classified elsewhere: Secondary | ICD-10-CM

## 2024-09-03 MED ORDER — PREDNISONE 10 MG (21) PO TBPK: ORAL_TABLET | ORAL | 0 refills | Status: AC

## 2024-09-03 MED ORDER — AZITHROMYCIN 250 MG PO TABS: ORAL_TABLET | ORAL | 0 refills | Status: AC

## 2024-09-03 MED ORDER — BENZONATATE 100 MG PO CAPS: 100.0000 mg | ORAL_CAPSULE | Freq: Two times a day (BID) | ORAL | 0 refills | Status: AC | PRN

## 2024-09-03 NOTE — Progress Notes (Signed)
 We are sorry that you are not feeling well.  Here is how we plan to help!  Based on your presentation I believe you most likely have A cough due to bacteria.  When patients have a fever and a productive cough with a change in color or increased sputum production, we are concerned about bacterial bronchitis.  If left untreated it can progress to pneumonia.  If your symptoms do not improve with your treatment plan it is important that you contact your provider.   I have prescribed Azithromyin 250 mg: two tablets now and then one tablet daily for 4 additonal days    In addition you may use A prescription cough medication called Tessalon  Perles 100mg . You may take 1-2 capsules every 8 hours as needed for your cough.  Prednisone  10 mg daily for 6 days (see taper instructions below)  Directions for 6 day taper: Day 1: 2 tablets before breakfast, 1 after both lunch & dinner and 2 at bedtime Day 2: 1 tab before breakfast, 1 after both lunch & dinner and 2 at bedtime Day 3: 1 tab at each meal & 1 at bedtime Day 4: 1 tab at breakfast, 1 at lunch, 1 at bedtime Day 5: 1 tab at breakfast & 1 tab at bedtime Day 6: 1 tab at breakfast  From your responses in the eVisit questionnaire you describe inflammation in the upper respiratory tract which is causing a significant cough.  This is commonly called Bronchitis and has four common causes:   Allergies Viral Infections Acid Reflux Bacterial Infection Allergies, viruses and acid reflux are treated by controlling symptoms or eliminating the cause. An example might be a cough caused by taking certain blood pressure medications. You stop the cough by changing the medication. Another example might be a cough caused by acid reflux. Controlling the reflux helps control the cough.  USE OF BRONCHODILATOR (RESCUE) INHALERS: There is a risk from using your bronchodilator too frequently.  The risk is that over-reliance on a medication which only relaxes the muscles  surrounding the breathing tubes can reduce the effectiveness of medications prescribed to reduce swelling and congestion of the tubes themselves.  Although you feel brief relief from the bronchodilator inhaler, your asthma may actually be worsening with the tubes becoming more swollen and filled with mucus.  This can delay other crucial treatments, such as oral steroid medications. If you need to use a bronchodilator inhaler daily, several times per day, you should discuss this with your provider.  There are probably better treatments that could be used to keep your asthma under control.     HOME CARE Only take medications as instructed by your medical team. Complete the entire course of an antibiotic. Drink plenty of fluids and get plenty of rest. Avoid close contacts especially the very young and the elderly Cover your mouth if you cough or cough into your sleeve. Always remember to wash your hands A steam or ultrasonic humidifier can help congestion.   GET HELP RIGHT AWAY IF: You develop worsening fever. You become short of breath You cough up blood. Your symptoms persist after you have completed your treatment plan MAKE SURE YOU  Understand these instructions. Will watch your condition. Will get help right away if you are not doing well or get worse.  Your e-visit answers were reviewed by a board certified advanced clinical practitioner to complete your personal care plan.  Depending on the condition, your plan could have included both over the counter or prescription  medications. If there is a problem please reply  once you have received a response from your provider. Your safety is important to us .  If you have drug allergies check your prescription carefully.    You can use MyChart to ask questions about todays visit, request a non-urgent call back, or ask for a work or school excuse for 24 hours related to this e-Visit. If it has been greater than 24 hours you will need to follow up  with your provider, or enter a new e-Visit to address those concerns. You will get an e-mail in the next two days asking about your experience.  I hope that your e-visit has been valuable and will speed your recovery. Thank you for using e-visits.   I have spent 5 minutes in review of e-visit questionnaire, review and updating patient chart, medical decision making and response to patient.   Hadassah Fireman, NP

## 2024-09-04 ENCOUNTER — Ambulatory Visit: Payer: Self-pay
